# Patient Record
Sex: Female | Born: 1950
Health system: Southern US, Community
[De-identification: ages and names within clinical notes are randomized; demographics above are authoritative.]

## PROBLEM LIST (undated history)

## (undated) DIAGNOSIS — M549 Dorsalgia, unspecified: Secondary | ICD-10-CM

## (undated) DIAGNOSIS — R059 Cough, unspecified: Secondary | ICD-10-CM

## (undated) DIAGNOSIS — R05 Cough: Secondary | ICD-10-CM

## (undated) HISTORY — PX: SHOULDER ARTHROSCOPY W/ ROTATOR CUFF REPAIR: SHX2400

## (undated) HISTORY — DX: Cough, unspecified: R05.9

## (undated) HISTORY — PX: OTHER SURGICAL HISTORY: SHX169

## (undated) HISTORY — DX: Cough: R05

## (undated) HISTORY — PX: NECK SURGERY: SHX720

## (undated) HISTORY — PX: NASAL SINUS SURGERY: SHX719

## (undated) HISTORY — DX: Dorsalgia, unspecified: M54.9

---

## 1998-02-11 ENCOUNTER — Ambulatory Visit (HOSPITAL_COMMUNITY): Admission: RE | Admit: 1998-02-11 | Discharge: 1998-02-11 | Payer: Self-pay | Admitting: Neurosurgery

## 1999-04-19 ENCOUNTER — Emergency Department (HOSPITAL_COMMUNITY): Admission: EM | Admit: 1999-04-19 | Discharge: 1999-04-19 | Payer: Self-pay | Admitting: Emergency Medicine

## 1999-04-19 ENCOUNTER — Encounter: Payer: Self-pay | Admitting: Emergency Medicine

## 1999-11-26 ENCOUNTER — Ambulatory Visit (HOSPITAL_COMMUNITY): Admission: RE | Admit: 1999-11-26 | Discharge: 1999-11-26 | Payer: Self-pay | Admitting: Orthopedic Surgery

## 1999-11-26 ENCOUNTER — Encounter: Payer: Self-pay | Admitting: Orthopedic Surgery

## 1999-12-14 ENCOUNTER — Encounter: Payer: Self-pay | Admitting: Obstetrics and Gynecology

## 1999-12-14 ENCOUNTER — Encounter: Admission: RE | Admit: 1999-12-14 | Discharge: 1999-12-14 | Payer: Self-pay | Admitting: Obstetrics and Gynecology

## 2000-11-14 ENCOUNTER — Other Ambulatory Visit: Admission: RE | Admit: 2000-11-14 | Discharge: 2000-11-14 | Payer: Self-pay | Admitting: Obstetrics and Gynecology

## 2001-12-28 ENCOUNTER — Encounter: Admission: RE | Admit: 2001-12-28 | Discharge: 2001-12-28 | Payer: Self-pay | Admitting: Gynecology

## 2001-12-28 ENCOUNTER — Encounter: Payer: Self-pay | Admitting: Gynecology

## 2002-06-10 ENCOUNTER — Other Ambulatory Visit: Admission: RE | Admit: 2002-06-10 | Discharge: 2002-06-10 | Payer: Self-pay | Admitting: Obstetrics and Gynecology

## 2002-06-18 ENCOUNTER — Ambulatory Visit (HOSPITAL_COMMUNITY): Admission: RE | Admit: 2002-06-18 | Discharge: 2002-06-18 | Payer: Self-pay | Admitting: Obstetrics and Gynecology

## 2002-06-18 ENCOUNTER — Encounter: Payer: Self-pay | Admitting: Obstetrics and Gynecology

## 2003-01-03 ENCOUNTER — Encounter: Payer: Self-pay | Admitting: Obstetrics and Gynecology

## 2003-01-03 ENCOUNTER — Encounter: Admission: RE | Admit: 2003-01-03 | Discharge: 2003-01-03 | Payer: Self-pay | Admitting: Obstetrics and Gynecology

## 2004-06-22 ENCOUNTER — Other Ambulatory Visit: Admission: RE | Admit: 2004-06-22 | Discharge: 2004-06-22 | Payer: Self-pay | Admitting: Obstetrics and Gynecology

## 2005-06-02 ENCOUNTER — Emergency Department (HOSPITAL_COMMUNITY): Admission: EM | Admit: 2005-06-02 | Discharge: 2005-06-02 | Payer: Self-pay | Admitting: Emergency Medicine

## 2005-08-31 ENCOUNTER — Encounter: Payer: Self-pay | Admitting: Neurosurgery

## 2005-11-06 ENCOUNTER — Emergency Department (HOSPITAL_COMMUNITY): Admission: EM | Admit: 2005-11-06 | Discharge: 2005-11-07 | Payer: Self-pay | Admitting: Emergency Medicine

## 2006-03-15 ENCOUNTER — Ambulatory Visit: Payer: Self-pay | Admitting: Internal Medicine

## 2006-07-25 ENCOUNTER — Ambulatory Visit: Payer: Self-pay | Admitting: Internal Medicine

## 2006-10-09 ENCOUNTER — Encounter: Admission: RE | Admit: 2006-10-09 | Discharge: 2006-10-09 | Payer: Self-pay | Admitting: Obstetrics and Gynecology

## 2006-10-13 ENCOUNTER — Ambulatory Visit: Payer: Self-pay | Admitting: Internal Medicine

## 2007-11-07 ENCOUNTER — Ambulatory Visit: Payer: Self-pay | Admitting: Internal Medicine

## 2007-11-07 DIAGNOSIS — M79609 Pain in unspecified limb: Secondary | ICD-10-CM

## 2007-11-07 HISTORY — DX: Pain in unspecified limb: M79.609

## 2008-09-12 ENCOUNTER — Encounter: Admission: RE | Admit: 2008-09-12 | Discharge: 2008-09-12 | Payer: Self-pay | Admitting: Obstetrics and Gynecology

## 2008-10-29 ENCOUNTER — Telehealth (INDEPENDENT_AMBULATORY_CARE_PROVIDER_SITE_OTHER): Payer: Self-pay | Admitting: *Deleted

## 2008-10-30 ENCOUNTER — Ambulatory Visit: Payer: Self-pay | Admitting: Family Medicine

## 2009-02-18 ENCOUNTER — Ambulatory Visit: Payer: Self-pay | Admitting: Family Medicine

## 2009-02-18 DIAGNOSIS — M549 Dorsalgia, unspecified: Secondary | ICD-10-CM

## 2009-02-18 HISTORY — DX: Dorsalgia, unspecified: M54.9

## 2009-02-20 ENCOUNTER — Telehealth: Payer: Self-pay | Admitting: Internal Medicine

## 2009-02-21 ENCOUNTER — Emergency Department (HOSPITAL_COMMUNITY): Admission: EM | Admit: 2009-02-21 | Discharge: 2009-02-21 | Payer: Self-pay | Admitting: Family Medicine

## 2009-04-09 ENCOUNTER — Encounter: Payer: Self-pay | Admitting: Internal Medicine

## 2009-06-04 ENCOUNTER — Encounter: Payer: Self-pay | Admitting: Internal Medicine

## 2009-06-05 ENCOUNTER — Inpatient Hospital Stay (HOSPITAL_COMMUNITY): Admission: AD | Admit: 2009-06-05 | Discharge: 2009-06-09 | Payer: Self-pay | Admitting: Neurosurgery

## 2009-07-30 ENCOUNTER — Encounter: Payer: Self-pay | Admitting: Internal Medicine

## 2009-11-05 ENCOUNTER — Encounter: Payer: Self-pay | Admitting: Internal Medicine

## 2010-05-17 ENCOUNTER — Emergency Department (HOSPITAL_BASED_OUTPATIENT_CLINIC_OR_DEPARTMENT_OTHER)
Admission: EM | Admit: 2010-05-17 | Discharge: 2010-05-18 | Payer: Self-pay | Source: Home / Self Care | Admitting: Emergency Medicine

## 2010-05-19 LAB — BASIC METABOLIC PANEL
BUN: 17 mg/dL (ref 6–23)
CO2: 26 mEq/L (ref 19–32)
Calcium: 9.4 mg/dL (ref 8.4–10.5)
Chloride: 108 mEq/L (ref 96–112)
Creatinine, Ser: 0.8 mg/dL (ref 0.4–1.2)
GFR calc Af Amer: >60 mL/min (ref >60)
GFR calc non Af Amer: >60 mL/min (ref >60)
Glucose, Bld: 93 mg/dL (ref 70–99)
Potassium: 4 mEq/L (ref 3.5–5.1)
Sodium: 145 mEq/L (ref 135–145)

## 2010-05-19 LAB — D-DIMER, QUANTITATIVE: D-Dimer, Quant: 0.23 ug/mL-FEU (ref 0.00–0.48)

## 2010-06-01 NOTE — Letter (Signed)
Summary: [facility]  [facility]   Imported By: Maryln Gottron 05/19/2009 14:31:20  _____________________________________________________________________  External Attachment:    Type:   Image     Comment:   External Document

## 2010-06-01 NOTE — Letter (Signed)
Summary: [facility]  [facility]   Imported By: Maryln Gottron 06/18/2009 11:11:26  _____________________________________________________________________  External Attachment:    Type:   Image     Comment:   External Document

## 2010-06-01 NOTE — Letter (Signed)
Summary: [facility]-page 2&3  [facility]-page 2&3   Imported By: Maryln Gottron 06/04/2009 10:21:02  _____________________________________________________________________  External Attachment:    Type:   Image     Comment:   External Document

## 2010-06-01 NOTE — Letter (Signed)
Summary: [facility]  [facility]   Imported By: Maryln Gottron 12/03/2009 10:51:50  _____________________________________________________________________  External Attachment:    Type:   Image     Comment:   External Document

## 2010-06-01 NOTE — Letter (Signed)
Summary: [facility]  [facility]   Imported By: Maryln Gottron 08/19/2009 14:06:29  _____________________________________________________________________  External Attachment:    Type:   Image     Comment:   External Document

## 2010-07-12 ENCOUNTER — Encounter: Payer: Self-pay | Admitting: Family Medicine

## 2010-07-12 ENCOUNTER — Ambulatory Visit (INDEPENDENT_AMBULATORY_CARE_PROVIDER_SITE_OTHER): Payer: BC Managed Care – PPO | Admitting: Family Medicine

## 2010-07-12 DIAGNOSIS — J309 Allergic rhinitis, unspecified: Secondary | ICD-10-CM

## 2010-07-12 MED ORDER — AZELASTINE HCL 0.1 % NA SOLN: 1.0000 | Freq: Two times a day (BID) | NASAL | Status: DC

## 2010-07-12 NOTE — Progress Notes (Signed)
  Subjective:    Patient ID: Kathryn Phillips, female    DOB: [birth year], 60 y.o.   MRN: [id number]  HPI  patient seen with a two-week history of symptoms including watery eyes and clear nasal mucus and nasal congestion. Took Alka-Seltzer plus without much improvement.   She has increased cough which is mostly nonproductive. Also took  Theraflu without improvement.    Has history of seasonal allergies in the past. Denies any fever, chills, or purulent discharge. Nonsmoker.   Review of Systems  Constitutional: Negative for fever and chills.  HENT: Positive for congestion, rhinorrhea, sneezing and postnasal drip. Negative for sore throat.   Respiratory: Positive for cough. Negative for shortness of breath and wheezing.   Cardiovascular: Negative for chest pain.  Skin: Negative for rash.  Hematological: Negative for adenopathy.       Objective:   Physical Exam  patient alert and in no distress. Afebrile. Oropharynx is moist and clear Nasal exam reveals clear artery Eardrums no acute change Neck supple no adenopathy Chest clear to auscultation Heart regular rhythm and rate       Assessment & Plan:   seasonal allergic rhinitis. Prescription for Astelin nasal spray 1-2 sprays per nostril twice daily. Try over-the-counter Zyrtec or Allegra

## 2010-07-12 NOTE — Patient Instructions (Signed)
Take over the counter Allegra or Zyrtec. Follow up immediately if you develop any fever or other problems.

## 2010-07-19 ENCOUNTER — Encounter: Payer: Self-pay | Admitting: Family Medicine

## 2010-07-19 ENCOUNTER — Ambulatory Visit (INDEPENDENT_AMBULATORY_CARE_PROVIDER_SITE_OTHER): Payer: BC Managed Care – PPO | Admitting: Family Medicine

## 2010-07-19 DIAGNOSIS — J309 Allergic rhinitis, unspecified: Secondary | ICD-10-CM

## 2010-07-19 HISTORY — DX: Allergic rhinitis, unspecified: J30.9

## 2010-07-19 MED ORDER — PREDNISONE 10 MG PO TABS: ORAL_TABLET | ORAL | Status: DC

## 2010-07-19 MED ORDER — CHLORPHENIRAMINE-HYDROCODONE 8-10 MG/5ML PO LQCR: 5.0000 mL | Freq: Two times a day (BID) | ORAL | Status: AC | PRN

## 2010-07-19 NOTE — Progress Notes (Signed)
  Subjective:    Patient ID: Kathryn Phillips, female    DOB: 1950/10/10, 60 y.o.   MRN: [id number]  HPI  patient seen in followup from last visit. Refer to prior note. Using Astelin nasal without much improvement.  Visiting [place] part of the state of the weekend where pollen heavy and that seemed to worsen her symptoms. Clear nasal mucus. Postnasal drip with nighttime cough preventing sleep. Only 3 hours sleep per night. No fever. No purulent nasal discharge. Using Astelin nasal one spray per nostril twice daily. Has used Nasonex in the past.   Review of Systems  HENT: Positive for congestion, rhinorrhea, sneezing, postnasal drip and sinus pressure. Negative for hearing loss, ear pain, nosebleeds, sore throat, neck stiffness and voice change.   Respiratory: Positive for cough. Negative for shortness of breath and wheezing.        Objective:   Physical Exam  patient alert and in no distress.  Nasal exam reveals swollen mucosa with clear mucus. Sinuses nontender Ear drums normal OP clear Neck supple no adenopathy Chest clear to auscultation Heart regular rhythm and rate       Assessment & Plan:   allergic rhinitis. Increase Astelin 2 sprays per nostril twice daily. Tussionex for nighttime cough 1/2-1 teaspoon. Consider brief prednisone taper if symptoms not improving next couple days

## 2010-07-19 NOTE — Patient Instructions (Signed)
May increase astelin to 2 sprays per nostril twice daily.

## 2010-07-22 LAB — CBC
HCT: 37.6 % (ref 36.0–46.0)
Hemoglobin: 12.7 g/dL (ref 12.0–15.0)
MCHC: 33.8 g/dL (ref 30.0–36.0)
MCV: 88.7 fL (ref 78.0–100.0)
Platelets: 213 10*3/uL (ref 150–400)
RBC: 4.24 MIL/uL (ref 3.87–5.11)
RDW: 13.4 % (ref 11.5–15.5)
WBC: 5.1 10*3/uL (ref 4.0–10.5)

## 2010-07-27 ENCOUNTER — Telehealth: Payer: Self-pay | Admitting: *Deleted

## 2010-07-27 NOTE — Telephone Encounter (Signed)
Faxed refill request to fill Hydrocodone-chlorpheniram susp, 60ml, one tsp every 12 hours for cough

## 2010-07-28 ENCOUNTER — Telehealth: Payer: Self-pay | Admitting: *Deleted

## 2010-07-28 NOTE — Telephone Encounter (Signed)
Faxed request to fill Hydrocodone Chloroheniram susp cough syrup, 60 ml [facility] [place]

## 2010-07-29 ENCOUNTER — Emergency Department (HOSPITAL_BASED_OUTPATIENT_CLINIC_OR_DEPARTMENT_OTHER)
Admission: EM | Admit: 2010-07-29 | Discharge: 2010-07-29 | Disposition: A | Discharge status: Home / Self Care | Payer: Federal, State, Local not specified - PPO | Attending: Emergency Medicine | Admitting: Emergency Medicine

## 2010-07-29 ENCOUNTER — Emergency Department (INDEPENDENT_AMBULATORY_CARE_PROVIDER_SITE_OTHER): Payer: Federal, State, Local not specified - PPO

## 2010-07-29 DIAGNOSIS — R05 Cough: Secondary | ICD-10-CM

## 2010-07-29 DIAGNOSIS — R0789 Other chest pain: Secondary | ICD-10-CM | POA: Insufficient documentation

## 2010-07-29 DIAGNOSIS — R059 Cough, unspecified: Secondary | ICD-10-CM

## 2010-07-29 DIAGNOSIS — R071 Chest pain on breathing: Secondary | ICD-10-CM | POA: Insufficient documentation

## 2010-07-29 DIAGNOSIS — R079 Chest pain, unspecified: Secondary | ICD-10-CM

## 2010-08-17 NOTE — Telephone Encounter (Signed)
Rx called in, pt aware 

## 2011-07-05 ENCOUNTER — Encounter: Payer: Self-pay | Admitting: Obstetrics and Gynecology

## 2011-08-17 ENCOUNTER — Emergency Department (INDEPENDENT_AMBULATORY_CARE_PROVIDER_SITE_OTHER): Payer: Federal, State, Local not specified - PPO

## 2011-08-17 ENCOUNTER — Emergency Department (HOSPITAL_BASED_OUTPATIENT_CLINIC_OR_DEPARTMENT_OTHER)
Admission: EM | Admit: 2011-08-17 | Discharge: 2011-08-18 | Disposition: A | Discharge status: Home / Self Care | Payer: Federal, State, Local not specified - PPO | Attending: Emergency Medicine | Admitting: Emergency Medicine

## 2011-08-17 ENCOUNTER — Encounter (HOSPITAL_BASED_OUTPATIENT_CLINIC_OR_DEPARTMENT_OTHER): Payer: Self-pay | Admitting: Emergency Medicine

## 2011-08-17 DIAGNOSIS — J3489 Other specified disorders of nose and nasal sinuses: Secondary | ICD-10-CM | POA: Insufficient documentation

## 2011-08-17 DIAGNOSIS — J40 Bronchitis, not specified as acute or chronic: Secondary | ICD-10-CM

## 2011-08-17 DIAGNOSIS — R059 Cough, unspecified: Secondary | ICD-10-CM

## 2011-08-17 DIAGNOSIS — R05 Cough: Secondary | ICD-10-CM

## 2011-08-17 DIAGNOSIS — R062 Wheezing: Secondary | ICD-10-CM | POA: Insufficient documentation

## 2011-08-17 DIAGNOSIS — R0982 Postnasal drip: Secondary | ICD-10-CM | POA: Insufficient documentation

## 2011-08-17 MED ORDER — PREDNISONE 50 MG PO TABS
60.0000 mg | ORAL_TABLET | Freq: Once | ORAL | Status: AC
  Administered 2011-08-17: 60 mg via ORAL
  Filled 2011-08-17: qty 1

## 2011-08-17 MED ORDER — ALBUTEROL SULFATE (5 MG/ML) 0.5% IN NEBU
5.0000 mg | INHALATION_SOLUTION | Freq: Once | RESPIRATORY_TRACT | Status: AC
  Administered 2011-08-17: 5 mg via RESPIRATORY_TRACT
  Filled 2011-08-17 (×2): qty 0.5

## 2011-08-17 MED ORDER — IPRATROPIUM BROMIDE 0.02 % IN SOLN
0.5000 mg | Freq: Once | RESPIRATORY_TRACT | Status: AC
  Administered 2011-08-17: 0.5 mg via RESPIRATORY_TRACT

## 2011-08-17 MED ORDER — ALBUTEROL SULFATE (5 MG/ML) 0.5% IN NEBU
INHALATION_SOLUTION | RESPIRATORY_TRACT | Status: AC
  Administered 2011-08-17: 5 mg via RESPIRATORY_TRACT
  Filled 2011-08-17: qty 1

## 2011-08-17 MED ORDER — IPRATROPIUM BROMIDE 0.02 % IN SOLN
RESPIRATORY_TRACT | Status: AC
  Administered 2011-08-17: 0.5 mg via RESPIRATORY_TRACT
  Filled 2011-08-17: qty 2.5

## 2011-08-17 MED ORDER — ALBUTEROL SULFATE (5 MG/ML) 0.5% IN NEBU
5.0000 mg | INHALATION_SOLUTION | Freq: Once | RESPIRATORY_TRACT | Status: AC
  Administered 2011-08-17: 5 mg via RESPIRATORY_TRACT

## 2011-08-17 NOTE — ED Notes (Signed)
Pt reports sinus drainage with cough since her sinus surgery 6months ago. Pt has f/u with her surgeon twice before today, and has another appt on May 2 for same complaints. State she has continuous sinus drainage that worsens at night. The coughing has worsened "to the point that i cant even sleep at night now". Pt has been taking a nasal antibiotic "for weeks now".

## 2011-08-17 NOTE — ED Notes (Signed)
Pt c/o chest congestion and productive cough. Pt also has left rib pain.

## 2011-08-18 MED ORDER — DOXYCYCLINE HYCLATE 100 MG PO CAPS: 100.0000 mg | ORAL_CAPSULE | Freq: Two times a day (BID) | ORAL | Status: AC

## 2011-08-18 MED ORDER — IPRATROPIUM-ALBUTEROL 18-103 MCG/ACT IN AERO: 2.0000 | INHALATION_SPRAY | Freq: Four times a day (QID) | RESPIRATORY_TRACT | Status: DC | PRN

## 2011-08-18 MED ORDER — PREDNISONE 50 MG PO TABS: 50.0000 mg | ORAL_TABLET | Freq: Every day | ORAL | Status: DC

## 2011-08-18 MED ORDER — IBUPROFEN 800 MG PO TABS
800.0000 mg | ORAL_TABLET | Freq: Once | ORAL | Status: AC
  Administered 2011-08-18: 800 mg via ORAL
  Filled 2011-08-18: qty 1

## 2011-08-18 NOTE — ED Provider Notes (Signed)
History     CSN: [id number]  Arrival date & time 08/17/11  2102   First MD Initiated Contact with Patient 08/17/11 2313      Chief Complaint  Patient presents with  . Cough    (Consider location/radiation/quality/duration/timing/severity/associated sxs/prior treatment) Patient is a 61 y.o. female presenting with cough. The history is provided by the patient. No language interpreter was used.  Cough This is a recurrent problem. The current episode started more than 1 week ago. The problem occurs hourly. The problem has not changed since onset.Cough characteristics: clear sputum. There has been no fever. Associated symptoms include rhinorrhea and wheezing. Pertinent negatives include no chest pain, no chills, no sweats, no weight loss, no ear congestion, no ear pain, no headaches, no sore throat, no myalgias, no shortness of breath and no eye redness. She has tried decongestants for the symptoms. The treatment provided no relief. She is a smoker. Her past medical history does not include pneumonia.    Past Medical History  Diagnosis Date  . BACK PAIN, UPPER 02/18/2009    Past Surgical History  Procedure Date  . Cesarean section   . Myonectomy   . Shoulder arthroscopy w/ rotator cuff repair     No family history on file.  History  Substance Use Topics  . Smoking status: Passive Smoker  . Smokeless tobacco: Not on file  . Alcohol Use: No    OB History    Grav Para Term Preterm Abortions TAB SAB Ect Mult Living                  Review of Systems  Constitutional: Negative for chills and weight loss.  HENT: Positive for rhinorrhea and postnasal drip. Negative for ear pain and sore throat.   Eyes: Negative for redness.  Respiratory: Positive for cough and wheezing. Negative for shortness of breath.   Cardiovascular: Negative for chest pain, palpitations and leg swelling.  Gastrointestinal: Negative.   Genitourinary: Negative.   Musculoskeletal: Negative.  Negative for  myalgias.  Skin: Negative.   Neurological: Negative for headaches.  Hematological: Negative.   Psychiatric/Behavioral: Negative.     Allergies  Review of patient's allergies indicates no known allergies.  Home Medications   Current Outpatient Rx  Name Route Sig Dispense Refill  . ACETAMINOPHEN 100 MG/ML PO SOLN Oral Take 10 mg/kg by mouth every 4 (four) hours as needed. Patient used this medication for pain.    . IBUPROFEN 200 MG PO TABS Oral Take 400 mg by mouth every 6 (six) hours as needed. Patient used this medication for a headache.    Arloa Koh D PO Oral Take 10 mLs by mouth daily as needed. Patient used this medication for congestion.      BP 112/69  Pulse 111  Temp(Src) 98.5 F (36.9 C) (Oral)  Resp 18  SpO2 99%  Physical Exam  Constitutional: She is oriented to person, place, and time. She appears well-developed and well-nourished. No distress.  HENT:  Head: Normocephalic and atraumatic.  Mouth/Throat: Oropharynx is clear and moist.  Eyes: Conjunctivae are normal. Pupils are equal, round, and reactive to light.  Neck: Normal range of motion. Neck supple.  Cardiovascular: Normal rate and regular rhythm.   Pulmonary/Chest: She has wheezes.  Abdominal: Soft. Bowel sounds are normal. There is no tenderness. There is no rebound and no guarding.  Musculoskeletal: Normal range of motion. She exhibits no edema and no tenderness.  Lymphadenopathy:    She has no cervical adenopathy.  Neurological: She  is alert and oriented to person, place, and time.  Skin: Skin is warm and dry. She is not diaphoretic.  Psychiatric: She has a normal mood and affect.    ED Course  Procedures (including critical care time)  Labs Reviewed - No data to display Dg Chest 2 View  08/17/2011  *RADIOLOGY REPORT*  Clinical Data: Cough for 1 month.  CHEST - 2 VIEW  Comparison: Chest radiograph performed 07/29/2010  Findings: The lungs are well-aerated.  There is no evidence of focal  opacification, pleural effusion or pneumothorax.  Slightly increased haziness at the left lung base is thought to reflect overlying structures.  The heart is normal in size; the mediastinal contour is within normal limits.  No acute osseous abnormalities are seen.  Cervical spinal fusion hardware is partially imaged.  IMPRESSION: No acute cardiopulmonary process seen.  Original Report Authenticated By: Tonia Ghent, M.D.     No diagnosis found.    MDM  Follow up with Dr. Carolyne Fiscal and Dr. Cato Mulligan.  Post nasal drip playing a role.  Also question allergies.  Will need close follow up and question need for suppressive medication as is a recurrent problem.  Return for chest pain, shortness of breath fevers, or any concerns.  Follow up this week.  Patient and husband verbalize understanding and agree to follow up       Keller Bounds K Brittney Caraway-Rasch, MD 08/18/11 1610

## 2011-08-18 NOTE — ED Notes (Signed)
Pt states that she feels a lot better after the two HH[facility] tx.

## 2011-08-18 NOTE — Discharge Instructions (Signed)
Bronchitis Bronchitis is a problem of the air tubes leading to your lungs. This problem makes it hard for air to get in and out of the lungs. You may cough a lot because your air tubes are narrow. Going without care can cause lasting (chronic) bronchitis. HOME CARE   Drink enough fluids to keep your pee (urine) clear or pale yellow.   Use a cool mist humidifier.   Quit smoking if you smoke. If you keep smoking, the bronchitis might not get better.   Only take medicine as told by your doctor.  GET HELP RIGHT AWAY IF:   Coughing keeps you awake.   You start to wheeze.   You become more sick or weak.   You have a hard time breathing or get short of breath.   You cough up blood.   Coughing lasts more than 2 weeks.   You have a fever.   Your baby is older than 3 months with a rectal temperature of 102 F (38.9 C) or higher.   Your baby is 3 months old or younger with a rectal temperature of 100.4 F (38 C) or higher.  MAKE SURE YOU:  Understand these instructions.   Will watch your condition.   Will get help right away if you are not doing well or get worse.  Document Released: 10/05/2007 Document Revised: 04/07/2011 Document Reviewed: 03/20/2009 Exit[employer] Patient Information 2012 [employer]. 

## 2011-08-22 ENCOUNTER — Ambulatory Visit (INDEPENDENT_AMBULATORY_CARE_PROVIDER_SITE_OTHER): Payer: Federal, State, Local not specified - PPO | Admitting: Family

## 2011-08-22 ENCOUNTER — Encounter: Payer: Self-pay | Admitting: Family

## 2011-08-22 VITALS — BP 140/82 | Temp 98.6°F | Wt 191.0 lb

## 2011-08-22 DIAGNOSIS — R05 Cough: Secondary | ICD-10-CM

## 2011-08-22 DIAGNOSIS — J40 Bronchitis, not specified as acute or chronic: Secondary | ICD-10-CM

## 2011-08-22 MED ORDER — HYDROCOD POLST-CHLORPHEN POLST 10-8 MG/5ML PO LQCR: 5.0000 mL | Freq: Two times a day (BID) | ORAL | Status: DC | PRN

## 2011-08-22 NOTE — Progress Notes (Signed)
Subjective:    Patient ID: Kathryn Phillips, female    DOB: Oct 02, 1950, 61 y.o.   MRN: [id number]  HPI 61 year old [place] American female, patient of Dr. Dorris Singh is in today as an emergency department followup. She was diagnosed with bronchitis at the emergency department 4 days ago. It was prescribed prednisone, albuterol, and an antibiotic. Today, she is much better. However, she continues to have a cough that keeps her awake at night. Denies any lightheadedness, dizziness, chest pain, palpitations, shortness of breath or edema.   Review of Systems  Constitutional: Negative.   HENT: Positive for congestion and postnasal drip. Negative for sore throat, sinus pressure and ear discharge.   Eyes: Negative.   Respiratory: Positive for cough and wheezing. Negative for shortness of breath.   Skin: Negative.   Hematological: Negative.   Psychiatric/Behavioral: Negative.    Past Medical History  Diagnosis Date  . BACK PAIN, UPPER 02/18/2009    History   Social History  . Marital Status: Married    Spouse Name: N/A    Number of Children: N/A  . Years of Education: N/A   Occupational History  . Not on file.   Social History Main Topics  . Smoking status: Passive Smoker  . Smokeless tobacco: Not on file  . Alcohol Use: No  . Drug Use: No  . Sexually Active: Not on file   Other Topics Concern  . Not on file   Social History Narrative  . No narrative on file    Past Surgical History  Procedure Date  . Cesarean section   . Myonectomy   . Shoulder arthroscopy w/ rotator cuff repair     No family history on file.  No Known Allergies  Current Outpatient Prescriptions on File Prior to Visit  Medication Sig Dispense Refill  . albuterol-ipratropium (COMBIVENT) 18-103 MCG/ACT inhaler Inhale 2 puffs into the lungs every 6 (six) hours as needed for wheezing.  1 Inhaler  0  . doxycycline (VIBRAMYCIN) 100 MG capsule Take 1 capsule (100 mg total) by mouth 2 (two) times daily. One po  bid x 7 days  14 capsule  0  . ibuprofen (ADVIL,MOTRIN) 200 MG tablet Take 400 mg by mouth every 6 (six) hours as needed. Patient used this medication for a headache.      . predniSONE (DELTASONE) 50 MG tablet Take 1 tablet (50 mg total) by mouth daily.  5 tablet  0  . acetaminophen (TYLENOL) 100 MG/ML solution Take 10 mg/kg by mouth every 4 (four) hours as needed. Patient used this medication for pain.      . Pseudoephedrine-Guaifenesin (MUCINEX D PO) Take 10 mLs by mouth daily as needed. Patient used this medication for congestion.        BP 140/82  Temp(Src) 98.6 F (37 C) (Oral)  Wt 191 lb (86.637 kg)chart    Objective:   Physical Exam  Constitutional: She is oriented to person, place, and time. She appears well-developed and well-nourished.  HENT:  Head: Normocephalic and atraumatic.  Right Ear: External ear normal.  Left Ear: External ear normal.  Neck: Normal range of motion. Neck supple.  Cardiovascular: Normal rate, regular rhythm and normal heart sounds.   Pulmonary/Chest: Effort normal and breath sounds normal.  Neurological: She is alert and oriented to person, place, and time.  Skin: Skin is warm and dry.  Psychiatric: She has a normal mood and affect.          Assessment & Plan:  Assessment: Acute bronchitis,  cough  Plan: Tussionex 1 teaspoon twice a day when necessary. Continue current medications. Rest. Drink plenty of fluids. Patient call the office if symptoms worsen or persist, recheck as scheduled and when necessary.

## 2011-08-22 NOTE — Patient Instructions (Signed)

## 2011-09-12 ENCOUNTER — Telehealth: Payer: Self-pay | Admitting: Internal Medicine

## 2011-09-12 DIAGNOSIS — J40 Bronchitis, not specified as acute or chronic: Secondary | ICD-10-CM

## 2011-09-12 NOTE — Telephone Encounter (Signed)
Ok per Dr Swords referral order placed 

## 2011-09-12 NOTE — Telephone Encounter (Signed)
Pt was just in recently for in rd follow up on bronchitis. Pt is still having issues with her breathing and would like to be referred to [facility]. Please contact pt

## 2011-09-15 ENCOUNTER — Encounter: Payer: Self-pay | Admitting: Internal Medicine

## 2011-09-15 ENCOUNTER — Ambulatory Visit (INDEPENDENT_AMBULATORY_CARE_PROVIDER_SITE_OTHER): Payer: Federal, State, Local not specified - PPO | Admitting: Internal Medicine

## 2011-09-15 VITALS — BP 118/82 | HR 94 | Temp 97.4°F | Ht 65.0 in | Wt 191.4 lb

## 2011-09-15 DIAGNOSIS — R05 Cough: Secondary | ICD-10-CM

## 2011-09-15 MED ORDER — PANTOPRAZOLE SODIUM 40 MG PO TBEC: 40.0000 mg | DELAYED_RELEASE_TABLET | Freq: Every day | ORAL | Status: DC

## 2011-09-15 MED ORDER — FLUTICASONE PROPIONATE 50 MCG/ACT NA SUSP: 2.0000 | Freq: Every day | NASAL | Status: DC

## 2011-09-15 NOTE — Patient Instructions (Signed)
Cough is from sinus drainage, acid reflux, possible asthma,  All of this is working together to cause cyclical cough/LPR cough  #Sinus drainage  - start/continue netti pot daily (nurse will show you picture) -  start nasal steroid generic fluticasone inhaler 2 squirts each nostril daily as advised (nurse will do script)  #Possible Acid Reflux - start protonix 40mg  daily on empty stomach (nurse will do script),   - If protonix expensive,  take otc zegerid 20mg   1 capsule daily on empty stomach    - take diet sheet from [place] - avoid colas, spices, cheeses, spirits, red meats, beer, chocolates, fried foods etc.,   - sleep with head end of bed elevated  - eat small frequent meals  - do not go to bed for 3 hours after last meal  #Possible Asthma  - do methacholine challenge test asap next week (you cannot be pregnant or have heart disease for this test)  -once test done, call us for result review  #Cyclical cough  - please choose 2-3 days and observe complete voice rest - no talking or whispering  - at all times there  there is urge to cough, drink water or swallow or sip on throat lozenge  #Followup - I will see you in 4 weeks.  - cough score at followup - any problems call or come sooner

## 2011-09-15 NOTE — Progress Notes (Signed)
Subjective:    Patient ID: Kathryn Phillips, female    DOB: 06-Mar-1951, 61 y.o.   MRN: [id number]  HPI  IOV 09/15/2011  61 year old female. Body mass index is 31.85 kg/(m^2).  reports that she has never smoked. She does not have any smokeless tobacco history on file. Judie Petit, MD . ENT is Dr Madelynn Done at [facility]. She works in Chief Financial Officer in Clinical biochemist at [employer]  Referred for chronic cough. Has has 2 year history of sinus problems (polyps, blockage).  Reports chronic cough since Nov 2012. She felt this was due to chronic sinus drainage and had sinus surgery in Nov 2012 but this did not help cough. Says many different types of anti-tussives not helped. Cough worse at night and she needs to sit up to get relief. Describes episodes of cough and each cough episode can last hours. Course complicated by 1 ER visit that resulted in nebulizer Rx at [facility] and dc ? On combivent. Also, took 6 days of prednisone that helped somewhat.  Cough is associated with white thick mucus. Course is stable and unchanged. Cough worst at night, laughing, talking a lot. Cough improved by drinking hot tea or something warm to drink or staying quiet.  She denies GERD. She denies cola intake but drinks 1 coffee daily. Only occassional cheese. Denies wine intake. Denies ACE inhibitor intake.   RSI cough score 35 of total 45 and indicates severe and LPR cough   Dr Gretta Cool Reflux Symptom Index (> 13-15 suggestive of LPR cough) 0 -> 5  =  none ->severe problem  Hoarseness of problem with voice 2  Clearing  Of Throat 5  Excess throat mucus or feeling of post nasal drip 5  Difficulty swallowing food, liquid or tablets 3  Cough after eating or lying down 5  Breathing difficulties or choking episodes 4  Troublesome or annoying cough 5  Sensation of something sticking in throat or lump in throat 4  Heartburn, chest pain, indigestion, or stomach acid coming up 2  TOTAL 35     CXR 08/17/2011 - clear  (personally reviewed image and agree with assessment)   Past Medical History  Diagnosis Date  . BACK PAIN, UPPER 02/18/2009  . Cough      Family History  Problem Relation Age of Onset  . Diabetes Father      History   Social History  . Marital Status: Married    Spouse Name: N/A    Number of Children: N/A  . Years of Education: N/A   Occupational History  . Not on file.   Social History Main Topics  . Smoking status: Never Smoker   . Smokeless tobacco: Not on file  . Alcohol Use: No  . Drug Use: No  . Sexually Active: Not on file   Other Topics Concern  . Not on file   Social History Narrative  . No narrative on file     Allergies  Allergen Reactions  . Hydrocodone Nausea And Vomiting     Outpatient Prescriptions Prior to Visit  Medication Sig Dispense Refill  . acetaminophen (TYLENOL) 100 MG/ML solution Take 10 mg/kg by mouth every 4 (four) hours as needed. Patient used this medication for pain.      Marland Kitchen albuterol-ipratropium (COMBIVENT) 18-103 MCG/ACT inhaler Inhale 2 puffs into the lungs every 6 (six) hours as needed for wheezing.  1 Inhaler  0  . ibuprofen (ADVIL,MOTRIN) 200 MG tablet Take 400 mg by mouth  every 6 (six) hours as needed. Patient used this medication for a headache.      . chlorpheniramine-HYDROcodone (TUSSIONEX PENNKINETIC ER) 10-8 MG/5ML LQCR Take 5 mLs by mouth every 12 (twelve) hours as needed.  140 mL  0  . predniSONE (DELTASONE) 50 MG tablet Take 1 tablet (50 mg total) by mouth daily.  5 tablet  0  . Pseudoephedrine-Guaifenesin (MUCINEX D PO) Take 10 mLs by mouth daily as needed. Patient used this medication for congestion.            Review of Systems  Constitutional: Negative for fever and unexpected weight change.  HENT: Positive for congestion, postnasal drip and sinus pressure. Negative for ear pain, nosebleeds, sore throat, rhinorrhea, sneezing, trouble swallowing and dental problem.   Eyes: Negative for redness and itching.    Respiratory: Positive for cough, chest tightness, shortness of breath and wheezing.   Cardiovascular: Negative for palpitations and leg swelling.  Gastrointestinal: Negative for nausea and vomiting.  Genitourinary: Negative for dysuria.  Musculoskeletal: Negative for joint swelling.  Skin: Negative for rash.  Neurological: Negative for headaches.  Hematological: Does not bruise/bleed easily.  Psychiatric/Behavioral: Negative for dysphoric mood. The patient is not nervous/anxious.        Objective:   Physical Exam  Vitals reviewed. Constitutional: She is oriented to person, place, and time. She appears well-developed and well-nourished. No distress.  HENT:  Head: Normocephalic and atraumatic.  Right Ear: External ear normal.  Left Ear: External ear normal.  Mouth/Throat: Oropharynx is clear and moist. No oropharyngeal exudate.  Eyes: Conjunctivae and EOM are normal. Pupils are equal, round, and reactive to light. Right eye exhibits no discharge. Left eye exhibits no discharge. No scleral icterus.  Neck: Normal range of motion. Neck supple. No JVD present. No tracheal deviation present. No thyromegaly present.       Post nasal drip - mild +  Cardiovascular: Normal rate, regular rhythm, normal heart sounds and intact distal pulses.  Exam reveals no gallop and no friction rub.   No murmur heard. Pulmonary/Chest: Effort normal and breath sounds normal. No respiratory distress. She has no wheezes. She has no rales. She exhibits no tenderness.       Forced expiratory wheeze  Abdominal: Soft. Bowel sounds are normal. She exhibits no distension and no mass. There is no tenderness. There is no rebound and no guarding.  Musculoskeletal: Normal range of motion. She exhibits no edema and no tenderness.  Lymphadenopathy:    She has no cervical adenopathy.  Neurological: She is alert and oriented to person, place, and time. She has normal reflexes. No cranial nerve deficit. She exhibits normal  muscle tone. Coordination normal.  Skin: Skin is warm and dry. No rash noted. She is not diaphoretic. No erythema. No pallor.  Psychiatric: She has a normal mood and affect. Her behavior is normal. Judgment and thought content normal.          Assessment & Plan:

## 2011-09-18 DIAGNOSIS — R053 Chronic cough: Secondary | ICD-10-CM | POA: Insufficient documentation

## 2011-09-18 DIAGNOSIS — R05 Cough: Secondary | ICD-10-CM | POA: Insufficient documentation

## 2011-09-18 NOTE — Assessment & Plan Note (Signed)
Cough is from sinus drainage, acid reflux, possible asthma,  All of this is working together to cause cyclical cough/LPR cough  #Sinus drainage  - start/continue netti pot daily (nurse will show you picture) -  start nasal steroid generic fluticasone inhaler 2 squirts each nostril daily as advised (nurse will do script)  #Possible Acid Reflux - start protonix 40mg daily on empty stomach (nurse will do script),   - If protonix expensive,  take otc zegerid 20mg  1 capsule daily on empty stomach    - take diet sheet from us - avoid colas, spices, cheeses, spirits, red meats, beer, chocolates, fried foods etc.,   - sleep with head end of bed elevated  - eat small frequent meals  - do not go to bed for 3 hours after last meal  #Possible Asthma  - do methacholine challenge test asap next week (you cannot be pregnant or have heart disease for this test)  -once test done, call us for result review  #Cyclical cough  - please choose 2-3 days and observe complete voice rest - no talking or whispering  - at all times there  there is urge to cough, drink water or swallow or sip on throat lozenge  #Followup - I will see you in 4 weeks.  - cough score at followup - any problems call or come sooner  

## 2011-09-22 ENCOUNTER — Ambulatory Visit (HOSPITAL_COMMUNITY)
Admission: RE | Admit: 2011-09-22 | Discharge: 2011-09-22 | Disposition: A | Discharge status: Home / Self Care | Payer: Federal, State, Local not specified - PPO | Source: Ambulatory Visit | Attending: Internal Medicine | Admitting: Internal Medicine

## 2011-09-22 DIAGNOSIS — R05 Cough: Secondary | ICD-10-CM | POA: Insufficient documentation

## 2011-09-22 DIAGNOSIS — R059 Cough, unspecified: Secondary | ICD-10-CM | POA: Insufficient documentation

## 2011-09-22 LAB — PULMONARY FUNCTION TEST

## 2011-09-22 MED ORDER — ALBUTEROL SULFATE (5 MG/ML) 0.5% IN NEBU
2.5000 mg | INHALATION_SOLUTION | Freq: Once | RESPIRATORY_TRACT | Status: AC
  Administered 2011-09-22: 2.5 mg via RESPIRATORY_TRACT

## 2011-09-22 MED ORDER — METHACHOLINE 1 MG/ML NEB SOLN
2.0000 mL | Freq: Once | RESPIRATORY_TRACT | Status: AC
  Administered 2011-09-22: 2 mg via RESPIRATORY_TRACT

## 2011-09-22 MED ORDER — METHACHOLINE 4 MG/ML NEB SOLN
2.0000 mL | Freq: Once | RESPIRATORY_TRACT | Status: AC
  Administered 2011-09-22: 8 mg via RESPIRATORY_TRACT

## 2011-09-22 MED ORDER — METHACHOLINE 0.25 MG/ML NEB SOLN
2.0000 mL | Freq: Once | RESPIRATORY_TRACT | Status: AC
  Administered 2011-09-22: 0.5 mg via RESPIRATORY_TRACT

## 2011-09-22 MED ORDER — METHACHOLINE 0.0625 MG/ML NEB SOLN
2.0000 mL | Freq: Once | RESPIRATORY_TRACT | Status: AC
  Administered 2011-09-22: 0.125 mg via RESPIRATORY_TRACT

## 2011-09-22 MED ORDER — METHACHOLINE 16 MG/ML NEB SOLN
2.0000 mL | Freq: Once | RESPIRATORY_TRACT | Status: AC
  Administered 2011-09-22: 32 mg via RESPIRATORY_TRACT

## 2011-09-22 MED ORDER — SODIUM CHLORIDE 0.9 % IN NEBU
3.0000 mL | INHALATION_SOLUTION | Freq: Once | RESPIRATORY_TRACT | Status: AC
  Administered 2011-09-22: 3 mL via RESPIRATORY_TRACT

## 2011-09-27 ENCOUNTER — Telehealth: Payer: Self-pay | Admitting: Internal Medicine

## 2011-09-27 NOTE — Telephone Encounter (Signed)
Methacholine challenge 09/22/11 is inderminat. Please tell her I will see her at fu 4 weeks from mid may 2013 office visit and then decide on further course  For my perusal  - PC20 is 16mg  with 28% drop in fev1

## 2011-09-28 NOTE — Telephone Encounter (Signed)
ATC home number NA, no voicemail. ATC cell number NA, no voicemail. WCB.Carron Curie, CMA

## 2011-10-05 NOTE — Telephone Encounter (Signed)
ATC pt again and home number is disconnected and cell number will not accept incoming calls.  I was able to leave a message on work number. Carron Curie, CMA

## 2011-10-07 NOTE — Telephone Encounter (Signed)
Pt scheduled on 10-26-11 at 9:45am.Josephanthony Tindel Bono, CMA

## 2011-10-13 ENCOUNTER — Ambulatory Visit: Payer: Federal, State, Local not specified - PPO | Admitting: Internal Medicine

## 2011-10-26 ENCOUNTER — Ambulatory Visit (INDEPENDENT_AMBULATORY_CARE_PROVIDER_SITE_OTHER): Payer: Federal, State, Local not specified - PPO | Admitting: Internal Medicine

## 2011-10-26 ENCOUNTER — Encounter: Payer: Self-pay | Admitting: Internal Medicine

## 2011-10-26 VITALS — BP 114/62 | HR 86 | Temp 98.4°F | Ht 65.0 in | Wt 192.8 lb

## 2011-10-26 DIAGNOSIS — R05 Cough: Secondary | ICD-10-CM

## 2011-10-26 NOTE — Patient Instructions (Addendum)
Glad cough is much better Cough is from sinus drainage, acid reflux, possible asthma,  All of this is working together to cause cyclical cough/LPR cough  #Sinus drainage  - continue netti pot daily  -  continue nasal steroid generic fluticasone inhaler 2 squirts each nostril daily as advised (cma will ensure refill)  #Possible Acid Reflux - Is ok that you stopped your protonix but - continue to avoid colas, spices, cheeses, spirits, red meats, beer, chocolates, fried foods etc.,   - sleep with head end of bed elevated  - eat small frequent meals  - do not go to bed for 3 hours after last meal  #Possible asthma  - methacholine challenge test was indeterminate for asthma  - given improvement in cough with sinus and gerd measures, we will continue to monitor your cough without starting asthma treatment   #Followup - I will see you in 8  weeks.  - cough score at followup; if cough persists then we can try asthma treatment because that breathing test result was indeterminate for asthma - any problems call or come sooner  

## 2011-10-26 NOTE — Progress Notes (Signed)
Subjective:    Patient ID: Kathryn Phillips, female    DOB: [birth year], 61 y.o.   MRN: [id number]  HPI IOV 09/15/2011  61 year old female. Body mass index is 31.85 kg/(m^2).  reports that she has never smoked. She does not have any smokeless tobacco history on file. Judie Petit, MD . ENT is Dr Madelynn Done at [facility]. She works in Chief Financial Officer in Clinical biochemist at [employer]  Referred for chronic cough. Has has 2 year history of sinus problems (polyps, blockage).  Reports chronic cough since Nov 2012. She felt this was due to chronic sinus drainage and had sinus surgery in Nov 2012 but this did not help cough. Says many different types of anti-tussives not helped. Cough worse at night and she needs to sit up to get relief. Describes episodes of cough and each cough episode can last hours. Course complicated by 1 ER visit that resulted in nebulizer Rx at [facility] and dc ? On combivent. Also, took 6 days of prednisone that helped somewhat.  Cough is associated with white thick mucus. Course is stable and unchanged. Cough worst at night, laughing, talking a lot. Cough improved by drinking hot tea or something warm to drink or staying quiet.  She denies GERD. She denies cola intake but drinks 1 coffee daily. Only occassional cheese. Denies wine intake. Denies ACE inhibitor intake.   RSI cough score 35 of total 45 and indicates severe and LPR cough  CXR 08/17/2011 - clear (personally reviewed image and agree with assessment)   Cough is from sinus drainage, acid reflux, possible asthma,  All of this is working together to cause cyclical cough/LPR cough  #Sinus drainage  - start/continue netti pot daily (nurse will show you picture)  - start nasal steroid generic fluticasone inhaler 2 squirts each nostril daily as advised (nurse will do script)  #Possible Acid Reflux  - start protonix 40mg  daily on empty stomach (nurse will do script),  - If protonix expensive, take otc zegerid 20mg  1 capsule  daily on empty stomach  - take diet sheet from [place] - avoid colas, spices, cheeses, spirits, red meats, beer, chocolates, fried foods etc.,  - sleep with head end of bed elevated  - eat small frequent meals  - do not go to bed for 3 hours after last meal  #Possible Asthma  - do methacholine challenge test asap next week (you cannot be pregnant or have heart disease for this test)  -once test done, call us for result review  #Cyclical cough  - please choose 2-3 days and observe complete voice rest - no talking or whispering  - at all times there there is urge to cough, drink water or swallow or sip on throat lozenge  #Followup  - I will see you in 4 weeks.  - cough score at followup  - any problems call or come sooner    OV 10/26/2011  Folowup cough. Methacholine challenge 09/22/11 is inderminat. ( PC20 is 16mg  with 28% drop in fev1).  Cough much better after following sinus control measurs. She has not been too compliant with gerd control measures. RSI cough score now down to 12 (from 35) and reflects 3 fold improvement (see below). Kouffman cough differentiator suggest neurogenic/airway element to residual cough (score 2) as opposed to gerd (score 0).  She prefers to continue sinus drainage control and watch for further improvement. Currently not interested in steopping up gerd control and asthma Rx for residual cough  Dr Gretta Cool Reflux Symptom Index (> 13-15 suggestive of LPR cough) 0 -> 5  =  none ->severe problem 09/15/11 10/26/2011   Hoarseness of problem with voice 2 2  Clearing  Of Throat 5 2*  Excess throat mucus or feeling of post nasal drip 5 2*  Difficulty swallowing food, liquid or tablets 3 1*  Cough after eating or lying down 5 1*  Breathing difficulties or choking episodes 4 0**  Troublesome or annoying cough 5 2*  Sensation of something sticking in throat or lump in throat 4 2**  Heartburn, chest pain, indigestion, or stomach acid coming up 2 0  TOTAL 35 12   Past,  Family, Social reviewed: no change since last visit      Review of Systems  Constitutional: Negative for fever and unexpected weight change.  HENT: Negative for ear pain, nosebleeds, congestion, sore throat, rhinorrhea, sneezing, trouble swallowing, dental problem, postnasal drip and sinus pressure.   Eyes: Negative for redness and itching.  Respiratory: Negative for cough, chest tightness, shortness of breath and wheezing.   Cardiovascular: Negative for palpitations and leg swelling.  Gastrointestinal: Negative for nausea and vomiting.  Genitourinary: Negative for dysuria.  Musculoskeletal: Negative for joint swelling.  Skin: Negative for rash.  Neurological: Negative for headaches.  Hematological: Does not bruise/bleed easily.  Psychiatric/Behavioral: Negative for dysphoric mood. The patient is not nervous/anxious.        Objective:   Physical Exam Vitals reviewed. Constitutional: She is oriented to person, place, and time. She appears well-developed and well-nourished. No distress.  HENT:  Head: Normocephalic and atraumatic.  Right Ear: External ear normal.  Left Ear: External ear normal.  Mouth/Throat: Oropharynx is clear and moist. No oropharyngeal exudate.  Eyes: Conjunctivae and EOM are normal. Pupils are equal, round, and reactive to light. Right eye exhibits no discharge. Left eye exhibits no discharge. No scleral icterus.  Neck: Normal range of motion. Neck supple. No JVD present. No tracheal deviation present. No thyromegaly present.       Post nasal drip - resolved Cardiovascular: Normal rate, regular rhythm, normal heart sounds and intact distal pulses.  Exam reveals no gallop and no friction rub.   No murmur heard. Pulmonary/Chest: Effort normal and breath sounds normal. No respiratory distress. She has no wheezes. She has no rales. She exhibits no tenderness.       Forced expiratory wheeze - abesent this visit Abdominal: Soft. Bowel sounds are normal. She exhibits  no distension and no mass. There is no tenderness. There is no rebound and no guarding.  Musculoskeletal: Normal range of motion. She exhibits no edema and no tenderness.  Lymphadenopathy:    She has no cervical adenopathy.  Neurological: She is alert and oriented to person, place, and time. She has normal reflexes. No cranial nerve deficit. She exhibits normal muscle tone. Coordination normal.  Skin: Skin is warm and dry. No rash noted. She is not diaphoretic. No erythema. No pallor.  Psychiatric: She has a normal mood and affect. Her behavior is normal. Judgment and thought content normal.           Assessment & Plan:

## 2011-10-28 NOTE — Assessment & Plan Note (Signed)
Glad cough is much better Cough is from sinus drainage, acid reflux, possible asthma,  All of this is working together to cause cyclical cough/LPR cough  #Sinus drainage  - continue netti pot daily  -  continue nasal steroid generic fluticasone inhaler 2 squirts each nostril daily as advised (cma will ensure refill)  #Possible Acid Reflux - Is ok that you stopped your protonix but - continue to avoid colas, spices, cheeses, spirits, red meats, beer, chocolates, fried foods etc.,   - sleep with head end of bed elevated  - eat small frequent meals  - do not go to bed for 3 hours after last meal  #Possible asthma  - methacholine challenge test was indeterminate for asthma  - given improvement in cough with sinus and gerd measures, we will continue to monitor your cough without starting asthma treatment   #Followup - I will see you in 8  weeks.  - cough score at followup; if cough persists then we can try asthma treatment because that breathing test result was indeterminate for asthma - any problems call or come sooner

## 2011-12-22 ENCOUNTER — Ambulatory Visit: Payer: Federal, State, Local not specified - PPO | Admitting: Internal Medicine

## 2012-04-13 ENCOUNTER — Ambulatory Visit (HOSPITAL_BASED_OUTPATIENT_CLINIC_OR_DEPARTMENT_OTHER)
Admit: 2012-04-13 | Discharge: 2012-04-13 | Disposition: A | Discharge status: Home / Self Care | Payer: Federal, State, Local not specified - PPO | Attending: Emergency Medicine | Admitting: Emergency Medicine

## 2012-04-13 ENCOUNTER — Emergency Department (HOSPITAL_BASED_OUTPATIENT_CLINIC_OR_DEPARTMENT_OTHER)
Admission: EM | Admit: 2012-04-13 | Discharge: 2012-04-13 | Disposition: A | Discharge status: Home / Self Care | Payer: Federal, State, Local not specified - PPO | Attending: Emergency Medicine | Admitting: Emergency Medicine

## 2012-04-13 ENCOUNTER — Encounter (HOSPITAL_BASED_OUTPATIENT_CLINIC_OR_DEPARTMENT_OTHER): Payer: Self-pay | Admitting: Emergency Medicine

## 2012-04-13 ENCOUNTER — Emergency Department (HOSPITAL_BASED_OUTPATIENT_CLINIC_OR_DEPARTMENT_OTHER): Payer: Federal, State, Local not specified - PPO

## 2012-04-13 DIAGNOSIS — Z8739 Personal history of other diseases of the musculoskeletal system and connective tissue: Secondary | ICD-10-CM | POA: Insufficient documentation

## 2012-04-13 DIAGNOSIS — M25562 Pain in left knee: Secondary | ICD-10-CM

## 2012-04-13 DIAGNOSIS — Z79899 Other long term (current) drug therapy: Secondary | ICD-10-CM | POA: Insufficient documentation

## 2012-04-13 DIAGNOSIS — M25569 Pain in unspecified knee: Secondary | ICD-10-CM | POA: Insufficient documentation

## 2012-04-13 MED ORDER — OXYCODONE-ACETAMINOPHEN 5-325 MG PO TABS
2.0000 | ORAL_TABLET | Freq: Once | ORAL | Status: AC
  Administered 2012-04-13: 2 via ORAL
  Filled 2012-04-13 (×2): qty 2

## 2012-04-13 MED ORDER — OXYCODONE-ACETAMINOPHEN 5-325 MG PO TABS: 1.0000 | ORAL_TABLET | Freq: Four times a day (QID) | ORAL | Status: DC | PRN

## 2012-04-13 MED ORDER — ACETAMINOPHEN 325 MG PO TABS
ORAL_TABLET | ORAL | Status: AC
  Filled 2012-04-13: qty 2

## 2012-04-13 NOTE — ED Notes (Signed)
Pt reports severe knee pain to left knee, pt can ambulate on left leg, However tonight when she went to bed her knee pain got severe

## 2012-04-13 NOTE — ED Provider Notes (Signed)
History     CSN: [id number]  Arrival date & time 04/13/12  0107   First MD Initiated Contact with Patient 04/13/12 0122      Chief Complaint  Patient presents with  . Leg Pain    (Consider location/radiation/quality/duration/timing/severity/associated sxs/prior treatment) HPI Comments: Patient with three week history of left knee pain.  She denies any injury or trauma.  There is pain in the front of the knee and back of the knee that radiates into the calf.  Patient is a 61 y.o. female presenting with leg pain. The history is provided by the patient.  Leg Pain  Incident onset: 3 weeks ago. Incident location: no injury. There was no injury mechanism. The pain is present in the left knee. The pain is severe. The pain has been constant since onset. Pertinent negatives include no numbness. She reports no foreign bodies present. The symptoms are aggravated by activity, bearing weight and palpation. She has tried NSAIDs for the symptoms. The treatment provided no relief.    Past Medical History  Diagnosis Date  . BACK PAIN, UPPER 02/18/2009  . Cough     Past Surgical History  Procedure Date  . Cesarean section   . Myonectomy   . Shoulder arthroscopy w/ rotator cuff repair     Family History  Problem Relation Age of Onset  . Diabetes Father     History  Substance Use Topics  . Smoking status: Never Smoker   . Smokeless tobacco: Not on file  . Alcohol Use: No    OB History    Grav Para Term Preterm Abortions TAB SAB Ect Mult Living                  Review of Systems  Neurological: Negative for numbness.  All other systems reviewed and are negative.    Allergies  Hydrocodone  Home Medications   Current Outpatient Rx  Name  Route  Sig  Dispense  Refill  . ACETAMINOPHEN 100 MG/ML PO SOLN   Oral   Take 10 mg/kg by mouth every 4 (four) hours as needed. Patient used this medication for pain.         . ALBUTEROL SULFATE HFA 108 (90 BASE) MCG/ACT IN AERS  Inhalation   Inhale 2 puffs into the lungs every 6 (six) hours as needed.         Maximino Greenland 18-103 MCG/ACT IN AERO   Inhalation   Inhale 2 puffs into the lungs every 6 (six) hours as needed for wheezing.   1 Inhaler   0   . FLUTICASONE PROPIONATE 50 MCG/ACT NA SUSP   Nasal   Place 2 sprays into the nose daily.   16 g   6   . IBUPROFEN 200 MG PO TABS   Oral   Take 400 mg by mouth every 6 (six) hours as needed. Patient used this medication for a headache.         Marland Kitchen PANTOPRAZOLE SODIUM 40 MG PO TBEC   Oral   Take 1 tablet (40 mg total) by mouth daily.   30 tablet   6     BP 120/69  Pulse 84  Resp 18  Ht 5\' 5"  (1.651 m)  Wt 178 lb (80.74 kg)  BMI 29.62 kg/m2  SpO2 99%  Physical Exam  Nursing note and vitals reviewed. Constitutional: She is oriented to person, place, and time. She appears well-developed and well-nourished. No distress.  HENT:  Head: Normocephalic and atraumatic.  Mouth/Throat:  Oropharynx is clear and moist.  Neck: Normal range of motion. Neck supple.  Musculoskeletal:       The left knee has a small effusion but otherwise appears grossly normal.  There is no warmth or erythema.  There is ttp in the popliteal region but no palpable cord.  Homans sign is absent.  Distal pulses, motor, and sensation are intact.  There is pain with range of motion but no crepitus.  Neurological: She is alert and oriented to person, place, and time.  Skin: Skin is warm and dry. She is not diaphoretic.    ED Course  Procedures (including critical care time)  Labs Reviewed - No data to display No results found.   No diagnosis found.    MDM  The patient presents with worsening pain in the knee and back of the leg for the past three weeks in the absence of injury or trauma.  At the top of the differential are djd and dvt.  The xrays show mild degenerative changes and [place] is unavailable as they have left for the night.  I will have her return in the am  for a dvt study.  She will be instructed that if the study is negative, she should follow up with her pcp to discuss orthopedic follow up.  I will recommend nsaids and prescribe pain medications.          Geoffery Lyons, MD 04/13/12 [phone]

## 2013-02-03 ENCOUNTER — Encounter (HOSPITAL_BASED_OUTPATIENT_CLINIC_OR_DEPARTMENT_OTHER): Payer: Self-pay | Admitting: *Deleted

## 2013-02-03 ENCOUNTER — Emergency Department (HOSPITAL_BASED_OUTPATIENT_CLINIC_OR_DEPARTMENT_OTHER)
Admission: EM | Admit: 2013-02-03 | Discharge: 2013-02-03 | Disposition: A | Discharge status: Home / Self Care | Payer: Federal, State, Local not specified - PPO | Attending: Emergency Medicine | Admitting: Emergency Medicine

## 2013-02-03 DIAGNOSIS — Z79899 Other long term (current) drug therapy: Secondary | ICD-10-CM | POA: Insufficient documentation

## 2013-02-03 DIAGNOSIS — R252 Cramp and spasm: Secondary | ICD-10-CM | POA: Insufficient documentation

## 2013-02-03 DIAGNOSIS — M79605 Pain in left leg: Secondary | ICD-10-CM

## 2013-02-03 LAB — CBC WITH DIFFERENTIAL/PLATELET
Basophils Relative: 0 % (ref 0–1)
Eosinophils Absolute: 0.2 10*3/uL (ref 0.0–0.7)
Eosinophils Relative: 4 % (ref 0–5)
MCH: 28.2 pg (ref 26.0–34.0)
MCHC: 33.1 g/dL (ref 30.0–36.0)
MCV: 85.2 fL (ref 78.0–100.0)
Monocytes Relative: 10 % (ref 3–12)
Neutrophils Relative %: 44 % (ref 43–77)
Platelets: 237 10*3/uL (ref 150–400)

## 2013-02-03 LAB — COMPREHENSIVE METABOLIC PANEL
Albumin: 3.9 g/dL (ref 3.5–5.2)
Alkaline Phosphatase: 95 U/L (ref 39–117)
BUN: 16 mg/dL (ref 6–23)
Calcium: 9.9 mg/dL (ref 8.4–10.5)
GFR calc Af Amer: >90 mL/min (ref >90)
Glucose, Bld: 107 mg/dL — ABNORMAL HIGH (ref 70–99)
Potassium: 3.7 mEq/L (ref 3.5–5.1)
Sodium: 140 mEq/L (ref 135–145)
Total Protein: 6.7 g/dL (ref 6.0–8.3)

## 2013-02-03 LAB — CK: Total CK: 393 U/L — ABNORMAL HIGH (ref 7–177)

## 2013-02-03 LAB — D-DIMER, QUANTITATIVE: D-Dimer, Quant: <0.27 ug/mL-FEU (ref 0.00–0.48)

## 2013-02-03 MED ORDER — IBUPROFEN 800 MG PO TABS: 800.0000 mg | ORAL_TABLET | Freq: Three times a day (TID) | ORAL | Status: DC

## 2013-02-03 NOTE — ED Notes (Signed)
Pt c/o left thigh and lower leg cramping x 1 day

## 2013-02-03 NOTE — ED Provider Notes (Signed)
CSN: [id number]     Arrival date & time 02/03/13  1735 History  This chart was scribed for Kathryn Octave, MD by Ardelia Mems, ED Scribe. This patient was seen in room MH10/MH10 and the patient's care was started at 5:46 PM.   Chief Complaint  Patient presents with  . Leg Pain    The history is provided by the patient. No language interpreter was used.    HPI Comments: Kathryn Phillips is a 62 y.o. female who presents to the Emergency Department complaining of intermittent, severe "spasmic" left leg pain that woke her from sleep this morning. She states that the pain is mostly in her left thigh, but that it is also present in her left lower leg. She states that this morning, for several hours, she had this pain intermittently in 10-15 minute episodes. She states she believes her spasmic pain has subsided, and she currently reports pain described as "soreness" in her left leg. She denies numbness, weakness or tingling in her left leg. She denies pain in her right leg. She states that she has a history of similar pain in the past, and she received a negative [place] in 12/13 when she was seen for the same. She states that her PCP, Dr. Cato Mulligan, is not aware of this problem. She states that she has been eating and drinking normally. She denies recent long distance travel or recent antibiotic use. She denies fever, emesis, SOB, chest pain, abdominal pain, back pain or any other symptoms. She denies smoking or alcohol use.  PCP- Dr. Birdie Sons   Past Medical History  Diagnosis Date  . BACK PAIN, UPPER 02/18/2009  . Cough    Past Surgical History  Procedure Laterality Date  . Cesarean section    . Myonectomy    . Shoulder arthroscopy w/ rotator cuff repair     Family History  Problem Relation Age of Onset  . Diabetes Father    History  Substance Use Topics  . Smoking status: Never Smoker   . Smokeless tobacco: Not on file  . Alcohol Use: No   OB History   Grav Para Term Preterm Abortions  TAB SAB Ect Mult Living                 Review of Systems A complete 10 system review of systems was obtained and all systems are negative except as noted in the HPI and PMH.   Allergies  Hydrocodone  Home Medications   Current Outpatient Rx  Name  Route  Sig  Dispense  Refill  . acetaminophen (TYLENOL) 100 MG/ML solution   Oral   Take 10 mg/kg by mouth every 4 (four) hours as needed. Patient used this medication for pain.         Marland Kitchen albuterol (PROVENTIL HFA) 108 (90 BASE) MCG/ACT inhaler   Inhalation   Inhale 2 puffs into the lungs every 6 (six) hours as needed.         Marland Kitchen ibuprofen (ADVIL,MOTRIN) 200 MG tablet   Oral   Take 400 mg by mouth every 6 (six) hours as needed. Patient used this medication for a headache.         . ibuprofen (ADVIL,MOTRIN) 800 MG tablet   Oral   Take 1 tablet (800 mg total) by mouth 3 (three) times daily.   21 tablet   0   . oxyCODONE-acetaminophen (PERCOCET/ROXICET) 5-325 MG per tablet   Oral   Take 1-2 tablets by mouth every 6 (six) hours  as needed for pain.   20 tablet   0    Triage Vitals: BP 121/79  Pulse 91  Temp(Src) 98.5 F (36.9 C) (Oral)  Resp 16  Ht 5\' 6"  (1.676 m)  Wt 180 lb (81.647 kg)  BMI 29.07 kg/m2  SpO2 99%  Physical Exam  Nursing note and vitals reviewed. Constitutional: She is oriented to person, place, and time. She appears well-developed and well-nourished. No distress.  HENT:  Head: Normocephalic and atraumatic.  Eyes: EOM are normal.  Neck: Neck supple. No tracheal deviation present.  Cardiovascular: Normal rate.   Pulmonary/Chest: Effort normal. No respiratory distress.  Musculoskeletal: Normal range of motion. She exhibits no edema and no tenderness.  Full ROM of left hip, knee and ankle. Intact DP and PT pulse. 5/5 strength throughout. Distal sensation intact. No calf swelling or tenderness.   Neurological: She is alert and oriented to person, place, and time.  Skin: Skin is warm and dry.  No  lacerations or abrasions on skin.  Psychiatric: She has a normal mood and affect. Her behavior is normal.    ED Course  Procedures (including critical care time)  DIAGNOSTIC STUDIES: Oxygen Saturation is 99% on RA, normal by my interpretation.    COORDINATION OF CARE: 5:55 PM- Discussed plan to obtain diagnostic lab work. Also discussed plan for a fluid challenge. Pt advised of plan for treatment and pt agrees.  Labs Review Labs Reviewed  CBC WITH DIFFERENTIAL - Abnormal; Notable for the following:    Hemoglobin 11.8 (*)    HCT 35.6 (*)    All other components within normal limits  COMPREHENSIVE METABOLIC PANEL - Abnormal; Notable for the following:    Glucose, Bld 107 (*)    All other components within normal limits  CK - Abnormal; Notable for the following:    Total CK 393 (*)    All other components within normal limits  MAGNESIUM  D-DIMER, QUANTITATIVE   Imaging Review No results found.  MDM   1. Leg cramps    Left leg cramping and soreness for the past day. Recurrent problem that she's had in the past. No chest pain or shortness of breath. No trauma. No weakness, numbness or tingling. electrolytes unremarkable. D-dimer negative. CK mildly elevated at 393.  Doubt DVT but will have patient return for [place] tomorrow. Encourage hydration, NSAIDs, followup with PCP.  I personally performed the services described in this documentation, which was scribed in my presence. The recorded information has been reviewed and is accurate.   Kathryn Octave, MD 02/03/13 [phone]

## 2013-02-04 ENCOUNTER — Ambulatory Visit (HOSPITAL_BASED_OUTPATIENT_CLINIC_OR_DEPARTMENT_OTHER)
Admit: 2013-02-04 | Discharge: 2013-02-04 | Disposition: A | Discharge status: Home / Self Care | Payer: Federal, State, Local not specified - PPO | Attending: Emergency Medicine | Admitting: Emergency Medicine

## 2013-02-04 DIAGNOSIS — M79605 Pain in left leg: Secondary | ICD-10-CM

## 2013-02-04 DIAGNOSIS — M79609 Pain in unspecified limb: Secondary | ICD-10-CM | POA: Insufficient documentation

## 2013-05-02 HISTORY — PX: COLONOSCOPY: SHX174

## 2013-05-31 ENCOUNTER — Encounter: Payer: Self-pay | Admitting: Internal Medicine

## 2013-07-25 ENCOUNTER — Encounter: Payer: Self-pay | Admitting: Internal Medicine

## 2013-09-12 ENCOUNTER — Ambulatory Visit (AMBULATORY_SURGERY_CENTER): Payer: Self-pay

## 2013-09-12 VITALS — Ht 66.0 in | Wt 190.0 lb

## 2013-09-12 DIAGNOSIS — Z1211 Encounter for screening for malignant neoplasm of colon: Secondary | ICD-10-CM

## 2013-09-12 MED ORDER — MOVIPREP 100 G PO SOLR: 1.0000 | Freq: Once | ORAL | Status: DC

## 2013-09-12 NOTE — Progress Notes (Signed)
No allergies to eggs or soy No diet/weight loss meds No home oxygen No past problems with anesthesia  Has email  Emmi instructions given for colonoscopy 

## 2013-09-25 ENCOUNTER — Encounter: Payer: Self-pay | Admitting: Internal Medicine

## 2013-09-25 ENCOUNTER — Ambulatory Visit (AMBULATORY_SURGERY_CENTER): Payer: Federal, State, Local not specified - PPO | Admitting: Internal Medicine

## 2013-09-25 VITALS — BP 119/78 | HR 75 | Temp 97.6°F | Resp 21 | Ht 66.0 in | Wt 190.0 lb

## 2013-09-25 DIAGNOSIS — Z1211 Encounter for screening for malignant neoplasm of colon: Secondary | ICD-10-CM

## 2013-09-25 MED ORDER — SODIUM CHLORIDE 0.9 % IV SOLN: 500.0000 mL | INTRAVENOUS | Status: DC

## 2013-09-25 NOTE — Progress Notes (Signed)
A/ox3, pleased with MAC, report to RN 

## 2013-09-25 NOTE — Progress Notes (Signed)
No problems noted in the recovery room. maw 

## 2013-09-25 NOTE — Op Note (Signed)
[place]  [address] [place], [id number]   COLONOSCOPY PROCEDURE REPORT  PATIENT: Kathryn Phillips, Kathryn Phillips  MR#: [id number] BIRTHDATE: 1950/06/12 , 62  yrs. old GENDER: Female ENDOSCOPIST: Lafayette Dragon, MD REFERRED NT:[id number] Swords, M.D. PROCEDURE DATE:  09/25/2013 PROCEDURE:   Colonoscopy, screening First Screening Colonoscopy - Avg.  risk and is 50 yrs.  old or older - No.  Prior Negative Screening - Now for repeat screening. 10 or more years since last screening  History of Adenoma - Now for follow-up colonoscopy & has been > or = to 3 yrs.  N/A  Polyps Removed Today? No.  Recommend repeat exam, <10 yrs? No. ASA CLASS:   Class II INDICATIONS:Average risk patient for colon cancer and loss colonoscopy in March 2005 was normal. MEDICATIONS: MAC sedation, administered by CRNA and Propofol (Diprivan) 240 mg IV  DESCRIPTION OF PROCEDURE:   After the risks benefits and alternatives of the procedure were thoroughly explained, informed consent was obtained.  A digital rectal exam revealed no abnormalities of the rectum.   The LB [id number]  endoscope was introduced through the anus and advanced to the cecum, which was identified by both the appendix and ileocecal valve. No adverse events experienced.   The quality of the prep was excellent, using MoviPrep  The instrument was then slowly withdrawn as the colon was fully examined.      COLON FINDINGS: A normal appearing cecum, ileocecal valve, and appendiceal orifice were identified.  The ascending, hepatic flexure, transverse, splenic flexure, descending, sigmoid colon and rectum appeared unremarkable.  No polyps or cancers were seen. Retroflexed views revealed no abnormalities. The time to cecum=9 minutes 00 seconds.  Withdrawal time=6 minutes 32 seconds.  The scope was withdrawn and the procedure completed. COMPLICATIONS: There were no complications.  ENDOSCOPIC IMPRESSION: Normal  colon  RECOMMENDATIONS: high fiber diet Recall colonoscopy in 10 years   eSigned:  Lafayette Dragon, MD 09/25/2013 10:02 AM   cc:

## 2013-09-25 NOTE — Patient Instructions (Signed)
YOU HAD AN ENDOSCOPIC PROCEDURE TODAY AT THE [facility]: Refer to the procedure report that was given to you for any specific questions about what was found during the examination.  If the procedure report does not answer your questions, please call your gastroenterologist to clarify.  If you requested that your care partner not be given the details of your procedure findings, then the procedure report has been included in a sealed envelope for you to review at your convenience later.  YOU SHOULD EXPECT: Some feelings of bloating in the abdomen. Passage of more gas than usual.  Walking can help get rid of the air that was put into your GI tract during the procedure and reduce the bloating. If you had a lower endoscopy (such as a colonoscopy or flexible sigmoidoscopy) you may notice spotting of blood in your stool or on the toilet paper. If you underwent a bowel prep for your procedure, then you may not have a normal bowel movement for a few days.  DIET: Your first meal following the procedure should be a light meal and then it is ok to progress to your normal diet.  A half-sandwich or bowl of soup is an example of a good first meal.  Heavy or fried foods are harder to digest and may make you feel nauseous or bloated.  Likewise meals heavy in dairy and vegetables can cause extra gas to form and this can also increase the bloating.  Drink plenty of fluids but you should avoid alcoholic beverages for 24 hours.  ACTIVITY: Your care partner should take you home directly after the procedure.  You should plan to take it easy, moving slowly for the rest of the day.  You can resume normal activity the day after the procedure however you should NOT DRIVE or use heavy machinery for 24 hours (because of the sedation medicines used during the test).    SYMPTOMS TO REPORT IMMEDIATELY: A gastroenterologist can be reached at any hour.  During normal business hours, 8:30 AM to 5:00 PM Monday through Friday,  call [phone].  After hours and on weekends, please call the GI answering service at [phone] who will take a message and have the physician on call contact you.   Following lower endoscopy (colonoscopy or flexible sigmoidoscopy):  Excessive amounts of blood in the stool  Significant tenderness or worsening of abdominal pains  Swelling of the abdomen that is new, acute  Fever of 100F or higher   FOLLOW UP: If any biopsies were taken you will be contacted by phone or by letter within the next 1-3 weeks.  Call your gastroenterologist if you have not heard about the biopsies in 3 weeks.  Our staff will call the home number listed on your records the next business day following your procedure to check on you and address any questions or concerns that you may have at that time regarding the information given to you following your procedure. This is a courtesy call and so if there is no answer at the home number and we have not heard from you through the emergency physician on call, we will assume that you have returned to your regular daily activities without incident.  SIGNATURES/CONFIDENTIALITY: You and/or your care partner have signed paperwork which will be entered into your electronic medical record.  These signatures attest to the fact that that the information above on your After Visit Summary has been reviewed and is understood.  Full responsibility of the confidentiality of   this discharge information lies with you and/or your care-partner.    Handout was given to your care partner on a high fiber diet with liberal fluid intake. Yo u may resume your current medications today.. Please call if any questions or concerns.

## 2013-09-26 ENCOUNTER — Telehealth: Payer: Self-pay | Admitting: *Deleted

## 2013-09-26 NOTE — Telephone Encounter (Signed)
No answer, left message to call if questions or concerns. 

## 2014-06-04 ENCOUNTER — Other Ambulatory Visit: Payer: Self-pay | Admitting: Obstetrics and Gynecology

## 2014-06-04 DIAGNOSIS — Z1231 Encounter for screening mammogram for malignant neoplasm of breast: Secondary | ICD-10-CM

## 2014-06-13 ENCOUNTER — Ambulatory Visit
Admission: RE | Admit: 2014-06-13 | Discharge: 2014-06-13 | Disposition: A | Discharge status: Home / Self Care | Payer: Federal, State, Local not specified - PPO | Source: Ambulatory Visit | Attending: Obstetrics and Gynecology | Admitting: Obstetrics and Gynecology

## 2014-06-13 DIAGNOSIS — Z1231 Encounter for screening mammogram for malignant neoplasm of breast: Secondary | ICD-10-CM

## 2016-04-18 ENCOUNTER — Ambulatory Visit: Payer: Federal, State, Local not specified - PPO | Admitting: Family Medicine

## 2017-03-06 ENCOUNTER — Emergency Department (HOSPITAL_BASED_OUTPATIENT_CLINIC_OR_DEPARTMENT_OTHER)
Admission: EM | Admit: 2017-03-06 | Discharge: 2017-03-06 | Disposition: A | Discharge status: Home / Self Care | Payer: Federal, State, Local not specified - PPO | Attending: Emergency Medicine | Admitting: Emergency Medicine

## 2017-03-06 ENCOUNTER — Emergency Department (HOSPITAL_BASED_OUTPATIENT_CLINIC_OR_DEPARTMENT_OTHER): Payer: Federal, State, Local not specified - PPO

## 2017-03-06 ENCOUNTER — Encounter (HOSPITAL_BASED_OUTPATIENT_CLINIC_OR_DEPARTMENT_OTHER): Payer: Self-pay | Admitting: *Deleted

## 2017-03-06 ENCOUNTER — Other Ambulatory Visit: Payer: Self-pay

## 2017-03-06 DIAGNOSIS — J069 Acute upper respiratory infection, unspecified: Secondary | ICD-10-CM

## 2017-03-06 DIAGNOSIS — J399 Disease of upper respiratory tract, unspecified: Secondary | ICD-10-CM | POA: Diagnosis not present

## 2017-03-06 DIAGNOSIS — R05 Cough: Secondary | ICD-10-CM | POA: Diagnosis present

## 2017-03-06 DIAGNOSIS — R059 Cough, unspecified: Secondary | ICD-10-CM

## 2017-03-06 MED ORDER — PREDNISONE 20 MG PO TABS: 20.0000 mg | ORAL_TABLET | Freq: Every day | ORAL | 0 refills | Status: AC

## 2017-03-06 MED ORDER — BENZONATATE 100 MG PO CAPS: 100.0000 mg | ORAL_CAPSULE | Freq: Three times a day (TID) | ORAL | 0 refills | Status: DC

## 2017-03-06 MED FILL — predniSONE 20 MG TABS: 20 | 5 days supply | Qty: 5 | Fill #0

## 2017-03-06 MED FILL — BENZONATATE 100 MG CAPSULE: 100 | 7 days supply | Qty: 21 | Fill #0

## 2017-03-06 NOTE — ED Provider Notes (Signed)
Emergency Department Provider Note   I have reviewed the triage vital signs and the nursing notes.   HISTORY  Chief Complaint URI   HPI Kathryn Phillips is a 66 y.o. female since to the emergency department for evaluation of URI symptoms and productive cough with green sputum worsening over the past 5-6 days.  Patient has some chest discomfort only with coughing.  None at rest.  She denies any fevers or shaking chills.  She has had some sinus pressure along with runny nose.  She feels that the infection is "going to my chest."  She does not smoke cigarettes.  She has not seen blood in the sputum.  No pleuritic or exertional chest pain.  No abdominal discomfort.  She has no GI symptoms to report.  She is tried over-the-counter cough and cold medications with little to no relief.   Past Medical History:  Diagnosis Date  . BACK PAIN, UPPER 02/18/2009  . Cough     Patient Active Problem List   Diagnosis Date Noted  . 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 Allergic rhinitis 07/19/2010  . BACK PAIN, UPPER 02/18/2009  . ARM PAIN, LEFT 11/07/2007    Past Surgical History:  Procedure Laterality Date  . CESAREAN SECTION    . myonectomy    . NASAL SINUS SURGERY    . SHOULDER ARTHROSCOPY W/ ROTATOR CUFF REPAIR      Current Outpatient Rx  . Order #: [id number] Class: Historical Med  . Order #: [id number] Class: Historical Med  . Order #: [id number] Class: Historical Med  . Order #: [id number] Class: Print  . Order #: [id number] Class: Historical Med  . Order #: [id number] Class: Print  . Order #: [id number] Class: Print    Allergies Hydrocodone  Family History  Problem Relation Age of Onset  . Diabetes Father   . Colon cancer Neg Hx   . Pancreatic cancer Neg Hx   . Rectal cancer Neg Hx   . Stomach cancer Neg Hx     Social History Social History   Tobacco Use  . Smoking status: Never Smoker  . Smokeless tobacco: Never Used  Substance Use Topics  . Alcohol use: Yes  . Drug use: No    Review  of Systems  Constitutional: No fever/chills Eyes: No visual changes. ENT: No sore throat. Positive face pain.  Cardiovascular: Denies chest pain. Respiratory: Denies shortness of breath. Positive cough.  Gastrointestinal: No abdominal pain.  No nausea, no vomiting.  No diarrhea.  No constipation. Genitourinary: Negative for dysuria. Musculoskeletal: Negative for back pain. Skin: Negative for rash. Neurological: Negative for headaches, focal weakness or numbness.  10-point ROS otherwise negative.  ____________________________________________   PHYSICAL EXAM:  VITAL SIGNS: ED Triage Vitals  Enc Vitals Group     BP 03/06/17 1605 114/71     Pulse Rate 03/06/17 1605 75     Resp 03/06/17 1605 18     Temp 03/06/17 1605 97.9 F (36.6 C)     Temp Source 03/06/17 1605 Oral     SpO2 03/06/17 1605 98 %     Weight 03/06/17 1603 190 lb (86.2 kg)     Height 03/06/17 1603 5' 5.5" (1.664 m)     Pain Score 03/06/17 1602 8   Constitutional: Alert and oriented. Well appearing and in no acute distress. Eyes: Conjunctivae are normal. Head: Atraumatic. Nose: Positive congestion/rhinnorhea. Mouth/Throat: Mucous membranes are moist.  Oropharynx with mild erythema. No exudate.  Neck: No stridor.   Cardiovascular: Normal rate, regular rhythm. Good peripheral circulation.  Grossly normal heart sounds.   Respiratory: Normal respiratory effort.  No retractions. Lungs CTAB.  Gastrointestinal: Soft and nontender. No distention.  Musculoskeletal: No lower extremity tenderness nor edema. No gross deformities of extremities. Neurologic:  Normal speech and language. No gross focal neurologic deficits are appreciated.  Skin:  Skin is warm, dry and intact. No rash noted. ____________________________________________  RADIOLOGY  Dg Chest 2 View  Result Date: 03/06/2017 CLINICAL DATA:  Productive cough. EXAM: CHEST  2 VIEW COMPARISON:  Radiographs of August 17, 2011. FINDINGS: The heart size and mediastinal  contours are within normal limits. Both lungs are clear. No pneumothorax or pleural effusion is noted. The visualized skeletal structures are unremarkable. IMPRESSION: No active cardiopulmonary disease. Electronically Signed   By: Marijo Conception, M.D.   On: 03/06/2017 16:23    ____________________________________________   PROCEDURES  Procedure(s) performed:   Procedures  None ____________________________________________   INITIAL IMPRESSION / ASSESSMENT AND PLAN / ED COURSE  Pertinent labs & imaging results that were available during my care of the patient were reviewed by me and considered in my medical decision making (see chart for details).  Returns to the emergency department for evaluation of cough with face pressure and URI symptoms.  She is in no acute distress.  No hypoxemia.  Given prolonged symptoms with productive cough plan for chest x-ray to rule out pneumonia.  Suspect bronchitis.   04:33 PM Patient with normal chest x-ray.  Plan for discharge with brief steroid burst and Tessalon Perles for cough. Suspect viral infection. No abx at this time. Plan also for close PCP follow up.    At this time, I do not feel there is any life-threatening condition present. I have reviewed and discussed all results (EKG, imaging, lab, urine as appropriate), exam findings with patient. I have reviewed nursing notes and appropriate previous records.  I feel the patient is safe to be discharged home without further emergent workup. Discussed usual and customary return precautions. Patient and family (if present) verbalize understanding and are comfortable with this plan.  Patient will follow-up with their primary care provider. If they do not have a primary care provider, information for follow-up has been provided to them. All questions have been answered.  ____________________________________________  FINAL CLINICAL IMPRESSION(S) / ED DIAGNOSES  Final diagnoses:  Viral upper  respiratory tract infection  Cough     MEDICATIONS GIVEN DURING THIS VISIT:  Medications - No data to display   NEW OUTPATIENT MEDICATIONS STARTED DURING THIS VISIT:  Prednisone 20 mg daily x 5 days Tessalon    Note:  This document was prepared using Dragon voice recognition software and may include unintentional dictation errors.  Nanda Quinton, MD Emergency Medicine    Akiera Allbaugh, [facility], MD 03/06/17 [phone]

## 2017-03-06 NOTE — Discharge Instructions (Signed)

## 2017-03-06 NOTE — ED Triage Notes (Signed)
Cough with thick green sputum. Runny nose.

## 2017-03-06 NOTE — ED Notes (Signed)
In to see Pt. She is not in room

## 2018-01-26 ENCOUNTER — Ambulatory Visit: Payer: Federal, State, Local not specified - PPO | Admitting: Family Medicine

## 2018-02-07 ENCOUNTER — Encounter: Payer: Self-pay | Admitting: Family Medicine

## 2018-02-07 ENCOUNTER — Ambulatory Visit: Payer: Federal, State, Local not specified - PPO | Admitting: Family Medicine

## 2018-02-07 VITALS — BP 124/80 | HR 78 | Temp 98.4°F | Resp 12 | Ht 65.5 in | Wt 191.0 lb

## 2018-02-07 DIAGNOSIS — R519 Headache, unspecified: Secondary | ICD-10-CM

## 2018-02-07 DIAGNOSIS — R51 Headache: Secondary | ICD-10-CM

## 2018-02-07 DIAGNOSIS — J309 Allergic rhinitis, unspecified: Secondary | ICD-10-CM | POA: Diagnosis not present

## 2018-02-07 DIAGNOSIS — M542 Cervicalgia: Secondary | ICD-10-CM | POA: Diagnosis not present

## 2018-02-07 MED ORDER — BACLOFEN 10 MG PO TABS: 10.0000 mg | ORAL_TABLET | Freq: Every day | ORAL | 0 refills | Status: DC

## 2018-02-07 NOTE — Progress Notes (Signed)
ACUTE VISIT   HPI:  Chief Complaint  Patient presents with  . Migraine    Ms.Kathryn Phillips is a 67 y.o. female, who is here today complaining of headaches, which she has had for several months but more frequent for the past 6-8 weeks, morning. Achy, throbbing of frontal headache, wakes her up. "Bad headache", 10/10.  Denies associated fever,chills,visual changes,nausea,vomiting,or focal neurologic deficit.  She has taken Tylenol. States that she has to wear eye glasses more frequent now.  No Hx of OSA. Hx of allergic rhinitis, started with nasal congestion,rhinorrhea, and post nasal drainage.  Neck pain,intermittent.  She has been under more stress than usual. Pain radiated to right UE. Problem has been intermittent for 6 months. She has not identified exacerbating or alleviating factors.  Stable.  Cervical CT in 01/2009: Central disc protrusion and spinal stenosis at C4-5.  Moderate spinal stenosis at C5-6 due to spondylosis and a left paracentral disc protrusion.  Mild spinal stenosis at C6-7 due to spondylosis.   Review of Systems  Constitutional: Positive for fatigue. Negative for activity change, appetite change and fever.  HENT: Negative for mouth sores, nosebleeds and trouble swallowing.   Eyes: Negative for redness and visual disturbance.  Respiratory: Negative for cough, shortness of breath and wheezing.   Cardiovascular: Negative for chest pain, palpitations and leg swelling.  Gastrointestinal: Negative for abdominal pain, nausea and vomiting.       Negative for changes in bowel habits.  Genitourinary: Negative for decreased urine volume, difficulty urinating, dysuria and hematuria.  Musculoskeletal: Positive for neck pain. Negative for gait problem.  Neurological: Positive for headaches. Negative for syncope, weakness and numbness.  Psychiatric/Behavioral: Negative for confusion. The patient is nervous/anxious.       Current Outpatient  Medications on File Prior to Visit  Medication Sig Dispense Refill  . acetaminophen (TYLENOL) 100 MG/ML solution Take 10 mg/kg by mouth every 4 (four) hours as needed. Patient used this medication for pain.    . Multiple Vitamin (MULTIVITAMIN) tablet Take 1 tablet by mouth daily.     No current facility-administered medications on file prior to visit.      Past Medical History:  Diagnosis Date  . BACK PAIN, UPPER 02/18/2009  . Cough    Allergies  Allergen Reactions  . Hydrocodone Nausea And Vomiting    Social History   Socioeconomic History  . Marital status: Married    Spouse name: Not on file  . Number of children: Not on file  . Years of education: Not on file  . Highest education level: Not on file  Occupational History  . Not on file  Social Needs  . Financial resource strain: Not on file  . Food insecurity:    Worry: Not on file    Inability: Not on file  . Transportation needs:    Medical: Not on file    Non-medical: Not on file  Tobacco Use  . Smoking status: Never Smoker  . Smokeless tobacco: Never Used  Substance and Sexual Activity  . Alcohol use: Yes  . Drug use: No  . Sexual activity: Not on file  Lifestyle  . Physical activity:    Days per week: Not on file    Minutes per session: Not on file  . Stress: Not on file  Relationships  . Social connections:    Talks on phone: Not on file    Gets together: Not on file    Attends religious service: Not on  file    Active member of club or organization: Not on file    Attends meetings of clubs or organizations: Not on file    Relationship status: Not on file  Other Topics Concern  . Not on file  Social History Narrative  . Not on file    Vitals:   02/07/18 1612  BP: 124/80  Pulse: 78  Resp: 12  Temp: 98.4 F (36.9 C)  SpO2: 98%   Body mass index is 31.3 kg/m.    Physical Exam  Nursing note and vitals reviewed. Constitutional: She is oriented to person, place, and time. She appears  well-developed. No distress.  HENT:  Head: Normocephalic and atraumatic.  Nose: Right sinus exhibits no maxillary sinus tenderness and no frontal sinus tenderness. Left sinus exhibits no maxillary sinus tenderness and no frontal sinus tenderness.  Mouth/Throat: Oropharynx is clear and moist and mucous membranes are normal.  Hypertrophic turbinates.  Eyes: Pupils are equal, round, and reactive to light. Conjunctivae are normal.  Cardiovascular: Normal rate and regular rhythm.  No murmur heard. Pulses:      Dorsalis pedis pulses are 2+ on the right side, and 2+ on the left side.  Respiratory: Effort normal and breath sounds normal. No respiratory distress.  GI: Soft. She exhibits no mass. There is no hepatomegaly. There is no tenderness.  Musculoskeletal: She exhibits no edema.       Right shoulder: She exhibits normal range of motion and no tenderness.       Cervical back: She exhibits tenderness. She exhibits no bony tenderness.       Back:  Lymphadenopathy:    She has no cervical adenopathy.  Neurological: She is alert and oriented to person, place, and time. She has normal strength. No cranial nerve deficit. Gait normal.  Reflex Scores:      Bicep reflexes are 2+ on the right side and 2+ on the left side.      Patellar reflexes are 2+ on the right side and 2+ on the left side. Skin: Skin is warm. No rash noted. No erythema.  Psychiatric: She has a normal mood and affect.  Well groomed, good eye contact.      ASSESSMENT AND PLAN:   Ms. Kathryn Phillips was seen today for migraine.  Diagnoses and all orders for this visit:  Lab Results  Component Value Date   WBC 4.7 02/07/2018   HGB 12.6 02/07/2018   HCT 37.8 02/07/2018   MCV 85.0 02/07/2018   PLT 246.0 02/07/2018   Lab Results  Component Value Date   CREATININE 0.78 02/07/2018   BUN 13 02/07/2018   NA 140 02/07/2018   K 3.5 02/07/2018   CL 105 02/07/2018   CO2 26 02/07/2018     Headache, unspecified headache  type  Possible causes discussed. ? Tension headache vs migraine. I do not think imaging is needed today. Recommend arranging eye exam. Instructed about warning signs.   -     Basic metabolic panel -     CBC  Neck pain  Chronic. ? With radiculopathy. Side effects of muscle relaxants discussed. ROM neck exercises.   -     baclofen (LIORESAL) 10 MG tablet; Take 1 tablet (10 mg total) by mouth at bedtime.  Allergic rhinitis, unspecified seasonality, unspecified trigger  Problem could contribute to frontal headache. Nasal irrigations with saline several times per day recommended.    Return in about 4 weeks (around 03/07/2018) for 4-6 wks with PCP HA.    Inez Catalina  G. Martinique, MD  [facility]. [place] office.

## 2018-02-07 NOTE — Patient Instructions (Addendum)
A few things to remember from today's visit:   Headache, unspecified headache type - Plan: Basic metabolic panel, CBC  Neck pain - Plan: baclofen (LIORESAL) 10 MG tablet  Allergic rhinitis, unspecified seasonality, unspecified trigger  Please arrange eye exam. Saline nasal irrigations several times per day also recommended, this will help with nasal congestion and runny nose.    General Headache Without Cause A headache is pain or discomfort felt around the head or neck area. There are many causes and types of headaches. In some cases, the cause may not be found. Follow these instructions at home: Managing pain  Take over-the-counter and prescription medicines only as told by your doctor.  Lie down in a dark, quiet room when you have a headache.  If directed, apply ice to the head and neck area: ? Put ice in a plastic bag. ? Place a towel between your skin and the bag. ? Leave the ice on for 20 minutes, 2-3 times per day.  Use a heating pad or hot shower to apply heat to the head and neck area as told by your doctor.  Keep lights dim if bright lights bother you or make your headaches worse. Eating and drinking  Eat meals on a regular schedule.  Lessen how much alcohol you drink.  Lessen how much caffeine you drink, or stop drinking caffeine. General instructions  Keep all follow-up visits as told by your doctor. This is important.  Keep a journal to find out if certain things bring on headaches. For example, write down: ? What you eat and drink. ? How much sleep you get. ? Any change to your diet or medicines.  Relax by getting a massage or doing other relaxing activities.  Lessen stress.  Sit up straight. Do not tighten (tense) your muscles.  Do not use tobacco products. This includes cigarettes, chewing tobacco, or e-cigarettes. If you need help quitting, ask your doctor.  Exercise regularly as told by your doctor.  Get enough sleep. This often means 7-9  hours of sleep. Contact a doctor if:  Your symptoms are not helped by medicine.  You have a headache that feels different than the other headaches.  You feel sick to your stomach (nauseous) or you throw up (vomit).  You have a fever. Get help right away if:  Your headache becomes really bad.  You keep throwing up.  You have a stiff neck.  You have trouble seeing.  You have trouble speaking.  You have pain in the eye or ear.  Your muscles are weak or you lose muscle control.  You lose your balance or have trouble walking.  You feel like you will pass out (faint) or you pass out.  You have confusion. This information is not intended to replace advice given to you by your health care provider. Make sure you discuss any questions you have with your health care provider. Document Released: 01/26/2008 Document Revised: 09/24/2015 Document Reviewed: 08/11/2014 Elsevier Interactive Patient Education  [employer].  Please be sure medication list is accurate. If a new problem present, please set up appointment sooner than planned today.

## 2018-02-08 LAB — CBC
HEMATOCRIT: 37.8 % (ref 36.0–46.0)
Hemoglobin: 12.6 g/dL (ref 12.0–15.0)
MCHC: 33.4 g/dL (ref 30.0–36.0)
MCV: 85 fl (ref 78.0–100.0)
PLATELETS: 246 10*3/uL (ref 150.0–400.0)
RBC: 4.44 Mil/uL (ref 3.87–5.11)
RDW: 13.7 % (ref 11.5–15.5)
WBC: 4.7 10*3/uL (ref 4.0–10.5)

## 2018-02-08 LAB — BASIC METABOLIC PANEL
BUN: 13 mg/dL (ref 6–23)
CHLORIDE: 105 meq/L (ref 96–112)
CO2: 26 mEq/L (ref 19–32)
Calcium: 9.3 mg/dL (ref 8.4–10.5)
Creatinine, Ser: 0.78 mg/dL (ref 0.40–1.20)
GFR: 95.22 mL/min (ref >60.00)
GLUCOSE: 144 mg/dL — AB (ref 70–99)
POTASSIUM: 3.5 meq/L (ref 3.5–5.1)
SODIUM: 140 meq/L (ref 135–145)

## 2018-02-10 ENCOUNTER — Encounter: Payer: Self-pay | Admitting: Family Medicine

## 2018-02-21 ENCOUNTER — Ambulatory Visit: Payer: Federal, State, Local not specified - PPO | Admitting: Family Medicine

## 2018-03-07 ENCOUNTER — Other Ambulatory Visit: Payer: Self-pay | Admitting: Family Medicine

## 2018-03-07 DIAGNOSIS — M542 Cervicalgia: Secondary | ICD-10-CM

## 2018-04-04 ENCOUNTER — Other Ambulatory Visit: Payer: Self-pay | Admitting: *Deleted

## 2018-04-04 DIAGNOSIS — M542 Cervicalgia: Secondary | ICD-10-CM

## 2018-04-04 MED ORDER — BACLOFEN 10 MG PO TABS: 10.0000 mg | ORAL_TABLET | Freq: Every evening | ORAL | 0 refills | Status: AC | PRN

## 2018-10-09 ENCOUNTER — Other Ambulatory Visit (HOSPITAL_COMMUNITY): Payer: Self-pay | Admitting: Oncology

## 2018-10-09 DIAGNOSIS — C50919 Malignant neoplasm of unspecified site of unspecified female breast: Secondary | ICD-10-CM

## 2018-10-17 ENCOUNTER — Encounter (HOSPITAL_COMMUNITY): Payer: Federal, State, Local not specified - PPO

## 2019-04-22 ENCOUNTER — Other Ambulatory Visit: Payer: Self-pay

## 2019-04-23 ENCOUNTER — Other Ambulatory Visit: Payer: Self-pay

## 2019-04-23 ENCOUNTER — Ambulatory Visit (INDEPENDENT_AMBULATORY_CARE_PROVIDER_SITE_OTHER): Payer: Medicare Other | Admitting: Family Medicine

## 2019-04-23 ENCOUNTER — Encounter: Payer: Self-pay | Admitting: Family Medicine

## 2019-04-23 VITALS — BP 126/80 | HR 83 | Resp 16 | Ht 65.5 in | Wt 194.0 lb

## 2019-04-23 DIAGNOSIS — E66811 Obesity, class 1: Secondary | ICD-10-CM

## 2019-04-23 DIAGNOSIS — Z1159 Encounter for screening for other viral diseases: Secondary | ICD-10-CM | POA: Diagnosis not present

## 2019-04-23 DIAGNOSIS — Z6831 Body mass index (BMI) 31.0-31.9, adult: Secondary | ICD-10-CM

## 2019-04-23 DIAGNOSIS — E669 Obesity, unspecified: Secondary | ICD-10-CM

## 2019-04-23 DIAGNOSIS — G4700 Insomnia, unspecified: Secondary | ICD-10-CM

## 2019-04-23 DIAGNOSIS — Z23 Encounter for immunization: Secondary | ICD-10-CM

## 2019-04-23 DIAGNOSIS — R7309 Other abnormal glucose: Secondary | ICD-10-CM

## 2019-04-23 DIAGNOSIS — Z780 Asymptomatic menopausal state: Secondary | ICD-10-CM | POA: Diagnosis not present

## 2019-04-23 DIAGNOSIS — Z9189 Other specified personal risk factors, not elsewhere classified: Secondary | ICD-10-CM

## 2019-04-23 HISTORY — DX: Body mass index (BMI) 31.0-31.9, adult: Z68.31

## 2019-04-23 HISTORY — DX: Insomnia, unspecified: G47.00

## 2019-04-23 HISTORY — DX: Obesity, unspecified: E66.9

## 2019-04-23 LAB — HEMOGLOBIN A1C: Hgb A1c MFr Bld: 6.4 % (ref 4.6–6.5)

## 2019-04-23 LAB — BASIC METABOLIC PANEL
BUN: 12 mg/dL (ref 6–23)
CO2: 28 mEq/L (ref 19–32)
Calcium: 9.1 mg/dL (ref 8.4–10.5)
Chloride: 106 mEq/L (ref 96–112)
Creatinine, Ser: 0.7 mg/dL (ref 0.40–1.20)
GFR: 101.13 mL/min (ref >60.00)
Glucose, Bld: 113 mg/dL — ABNORMAL HIGH (ref 70–99)
Potassium: 4.4 mEq/L (ref 3.5–5.1)
Sodium: 141 mEq/L (ref 135–145)

## 2019-04-23 NOTE — Progress Notes (Addendum)
HPI:   Kathryn Phillips is a 68 y.o. female, who is here today to establish care.  Former PCP: Dr sword. Last preventive routine visit: 2017.  She has not had HCV screening. Overdue for mammogram. Last pap smear in 2017 or 2018. Colonoscopy in 08/2013. Chronic medical problems: Otherwise healthy except for allergies. Negative for Hx of HTN or HLD.  In 01/2018 glucose was elevated at 114. Denies abdominal pain, nausea,vomiting, polydipsia,polyuria, or polyphagia.  In general she follows a healthful diet. Not consistent with regular physical activity.   C/O difficulty falling asleep. She has not identified exacerbating factors. Sleeps about 5 hours. She has taken OTC Melatonin. Most of the time she feels rested when she gets up.   Review of Systems  Constitutional: Negative for activity change, appetite change, fatigue and fever.  HENT: Negative for mouth sores, nosebleeds and sore throat.   Eyes: Negative for redness and visual disturbance.  Respiratory: Negative for cough, shortness of breath and wheezing.   Cardiovascular: Negative for chest pain, palpitations and leg swelling.  Gastrointestinal: Negative for blood in stool.       Negative for changes in bowel habits.  Endocrine: Negative for cold intolerance and heat intolerance.  Genitourinary: Negative for decreased urine volume and hematuria.  Allergic/Immunologic: Positive for environmental allergies.  Neurological: Negative for syncope, weakness and headaches.  Psychiatric/Behavioral: Negative for confusion. The patient is not nervous/anxious.   Rest see pertinent positives and negatives per HPI.   Current Outpatient Medications on File Prior to Visit  Medication Sig Dispense Refill  . acetaminophen (TYLENOL) 100 MG/ML solution Take 10 mg/kg by mouth every 4 (four) hours as needed. Patient used this medication for pain.    . Multiple Vitamin (MULTIVITAMIN) tablet Take 1 tablet by mouth daily.      No current facility-administered medications on file prior to visit.     Past Medical History:  Diagnosis Date  . BACK PAIN, UPPER 02/18/2009  . Cough    Allergies  Allergen Reactions  . Hydrocodone Nausea And Vomiting    Family History  Problem Relation Age of Onset  . Diabetes Father   . Colon cancer Neg Hx   . Pancreatic cancer Neg Hx   . Rectal cancer Neg Hx   . Stomach cancer Neg Hx     Social History   Socioeconomic History  . Marital status: Married    Spouse name: Not on file  . Number of children: Not on file  . Years of education: Not on file  . Highest education level: Not on file  Occupational History  . Not on file  Tobacco Use  . Smoking status: Never Smoker  . Smokeless tobacco: Never Used  Substance and Sexual Activity  . Alcohol use: Yes  . Drug use: No  . Sexual activity: Not on file  Other Topics Concern  . Not on file  Social History Narrative  . Not on file   Social Determinants of Health   Financial Resource Strain:   . Difficulty of Paying Living Expenses: Not on file  Food Insecurity:   . Worried About Charity fundraiser in the Last Year: Not on file  . Ran Out of Food in the Last Year: Not on file  Transportation Needs:   . Lack of Transportation (Medical): Not on file  . Lack of Transportation (Non-Medical): Not on file  Physical Activity:   . Days of Exercise per Week: Not on file  . Minutes  of Exercise per Session: Not on file  Stress:   . Feeling of Stress : Not on file  Social Connections:   . Frequency of Communication with Friends and Family: Not on file  . Frequency of Social Gatherings with Friends and Family: Not on file  . Attends Religious Services: Not on file  . Active Member of Clubs or Organizations: Not on file  . Attends Archivist Meetings: Not on file  . Marital Status: Not on file    Vitals:   04/23/19 1157  BP: 126/80  Pulse: 83  Resp: 16  SpO2: 97%    Body mass index is 31.79  kg/m.  Physical Exam  Nursing note and vitals reviewed. Constitutional: She is oriented to person, place, and time. She appears well-developed. No distress.  HENT:  Head: Normocephalic and atraumatic.  Mouth/Throat: Oropharynx is clear and moist and mucous membranes are normal.  Eyes: Pupils are equal, round, and reactive to light. Conjunctivae are normal.  Cardiovascular: Normal rate and regular rhythm.  No murmur heard. Pulses:      Dorsalis pedis pulses are 2+ on the right side and 2+ on the left side.  Respiratory: Effort normal and breath sounds normal. No respiratory distress.  GI: Soft. She exhibits no mass. There is no hepatomegaly. There is no abdominal tenderness.  Musculoskeletal:        General: No edema.  Lymphadenopathy:    She has no cervical adenopathy.  Neurological: She is alert and oriented to person, place, and time. She has normal strength. No cranial nerve deficit. Gait normal.  Skin: Skin is warm. No rash noted. No erythema.  Psychiatric: She has a normal mood and affect.  Well groomed, good eye contact.    ASSESSMENT AND PLAN:  Kathryn Phillips was seen today for establish care.  Diagnoses and all orders for this visit:  Lab Results  Component Value Date   CREATININE 0.70 04/23/2019   BUN 12 04/23/2019   NA 141 04/23/2019   K 4.4 04/23/2019   CL 106 04/23/2019   CO2 28 04/23/2019   Lab Results  Component Value Date   HGBA1C 6.4 04/23/2019    Elevated glucose level Healthy life style for primary prevention encouraged. Further recommendations according to lab results.  -     Basic Metabolic Panel -     Hemoglobin A1c  Insomnia, unspecified type Good sleep hygiene. She can increase dose of Melatonin from 5 mg to 10 mg and take it 1-2 hours after supper. If problem is not better,we can discussed other treatment options.  Encounter for HCV screening test for high risk patient -     Hepatitis C Antibody  Asymptomatic postmenopausal estrogen  deficiency -     DG Bone Density; Future  Need for influenza vaccination -     Flu Vaccine QUAD High Dose(Fluad)  Need for 23-polyvalent pneumococcal polysaccharide vaccine -     Pneumococcal polysaccharide vaccine 23-valent greater than or equal to 2yo subcutaneous/IM  Class 1 obesity without serious comorbidity with body mass index (BMI) of 31.0 to 31.9 in adult, unspecified obesity type We discussed benefits of wt loss as well as adverse effects of obesity. Consistency with healthy diet and physical activity recommended.    Return for Needs a medicare visit.Marland Kitchen    Betty G. Martinique, MD  [facility]. [place] office.

## 2019-04-23 NOTE — Patient Instructions (Signed)
A few things to remember from today's visit:   Encounter for HCV screening test for high risk patient - Plan: Hepatitis C Antibody  Elevated glucose level - Plan: Basic Metabolic Panel, Hemoglobin A1c  Asymptomatic postmenopausal estrogen deficiency - Plan: DG Bone Density   Please be sure medication list is accurate. If a new problem present, please set up appointment sooner than planned today.

## 2019-04-24 LAB — HEPATITIS C ANTIBODY
Hepatitis C Ab: NONREACTIVE
SIGNAL TO CUT-OFF: 0.01 (ref <1.00)

## 2019-05-08 ENCOUNTER — Ambulatory Visit (INDEPENDENT_AMBULATORY_CARE_PROVIDER_SITE_OTHER): Payer: Medicare Other

## 2019-05-08 ENCOUNTER — Ambulatory Visit (INDEPENDENT_AMBULATORY_CARE_PROVIDER_SITE_OTHER)
Admission: RE | Admit: 2019-05-08 | Discharge: 2019-05-08 | Disposition: A | Discharge status: Home / Self Care | Payer: Medicare Other | Source: Ambulatory Visit | Attending: Family Medicine | Admitting: Family Medicine

## 2019-05-08 ENCOUNTER — Other Ambulatory Visit: Payer: Self-pay

## 2019-05-08 VITALS — BP 120/78 | Temp 97.3°F | Ht 65.5 in | Wt 194.2 lb

## 2019-05-08 DIAGNOSIS — Z0000 Encounter for general adult medical examination without abnormal findings: Secondary | ICD-10-CM

## 2019-05-08 DIAGNOSIS — Z1231 Encounter for screening mammogram for malignant neoplasm of breast: Secondary | ICD-10-CM

## 2019-05-08 DIAGNOSIS — Z780 Asymptomatic menopausal state: Secondary | ICD-10-CM | POA: Diagnosis not present

## 2019-05-08 NOTE — Patient Instructions (Addendum)
Ms. Kathryn Phillips , Thank you for taking time to come for your Medicare Wellness Visit. I appreciate your ongoing commitment to your health goals. Please review the following plan we discussed and let me know if I can assist you in the future.   Screening recommendations/referrals: Colorectal Screening: completed  09/25/2013 due again 09/26/2023. Mammogram: completed in 2017. Due now. Order sent to [facility] and they will contact you to schedule.  Bone Density: scheduled 05/08/2019. Up to date.   Vision and Dental Exams: Recommended annual ophthalmology exams for early detection of glaucoma and other disorders of the eye Recommended annual dental exams for proper oral hygiene  Diabetic Exams: Diabetic Eye Exam: N/A Diabetic Foot Exam: N/A  Vaccinations: Influenza vaccine: completed 04/23/2019. Up to date. Repeat in Fall 2021.  Pneumococcal vaccine: completed 04/23/2019. Tdap vaccine: Expired. Insurance does not pay unless you experience an injury or accident. You may still get it updated in our office however. Shingles vaccine: Please call your pharmacy to determine your out of pocket expense for the Shingrix vaccine. You may receive this vaccine at your local pharmacy. This is a series of two injections to be given 2-6 months apart.  Advanced directives: Advance directives discussed with you today. Advance directives discussed with you today. You have declined to receive documents for completion.  Goals: Recommend to drink at least 6-8 8oz glasses of water per day. Recommend to exercise for at least 150 minutes per week. Eat 3 meals per day. Make time for yourself so you can continue to take care of others.   Next appointment: Please schedule your Annual Wellness Visit with your Nurse Health Advisor in one year.  Preventive Care 67 Years and Older, Female Preventive care refers to lifestyle choices and visits with your health care provider that can promote health and wellness. What  does preventive care include?  A yearly physical exam. This is also called an annual well check.  Dental exams once or twice a year.  Routine eye exams. Ask your health care provider how often you should have your eyes checked.  Personal lifestyle choices, including:  Daily care of your teeth and gums.  Regular physical activity.  Eating a healthy diet.  Avoiding tobacco and drug use.  Limiting alcohol use.  Practicing safe sex.  Taking low-dose aspirin every day if recommended by your health care provider.  Taking vitamin and mineral supplements as recommended by your health care provider. What happens during an annual well check? The services and screenings done by your health care provider during your annual well check will depend on your age, overall health, lifestyle risk factors, and family history of disease. Counseling  Your health care provider may ask you questions about your:  Alcohol use.  Tobacco use.  Drug use.  Emotional well-being.  Home and relationship well-being.  Sexual activity.  Eating habits.  History of falls.  Memory and ability to understand (cognition).  Work and work Statistician.  Reproductive health. Screening  You may have the following tests or measurements:  Height, weight, and BMI.  Blood pressure.  Lipid and cholesterol levels. These may be checked every 5 years, or more frequently if you are over 24 years old.  Skin check.  Lung cancer screening. You may have this screening every year starting at age 14 if you have a 30-pack-year history of smoking and currently smoke or have quit within the past 15 years.  Fecal occult blood test (FOBT) of the stool. You may have this  test every year starting at age 67.  Flexible sigmoidoscopy or colonoscopy. You may have a sigmoidoscopy every 5 years or a colonoscopy every 10 years starting at age 41.  Hepatitis C blood test.  Hepatitis B blood test.  Sexually transmitted  disease (STD) testing.  Diabetes screening. This is done by checking your blood sugar (glucose) after you have not eaten for a while (fasting). You may have this done every 1-3 years.  Bone density scan. This is done to screen for osteoporosis. You may have this done starting at age 30.  Mammogram. This may be done every 1-2 years. Talk to your health care provider about how often you should have regular mammograms. Talk with your health care provider about your test results, treatment options, and if necessary, the need for more tests. Vaccines  Your health care provider may recommend certain vaccines, such as:  Influenza vaccine. This is recommended every year.  Tetanus, diphtheria, and acellular pertussis (Tdap, Td) vaccine. You may need a Td booster every 10 years.  Zoster vaccine. You may need this after age 89.  Pneumococcal 13-valent conjugate (PCV13) vaccine. One dose is recommended after age 77.  Pneumococcal polysaccharide (PPSV23) vaccine. One dose is recommended after age 79. Talk to your health care provider about which screenings and vaccines you need and how often you need them. This information is not intended to replace advice given to you by your health care provider. Make sure you discuss any questions you have with your health care provider. Document Released: 05/15/2015 Document Revised: 01/06/2016 Document Reviewed: 02/17/2015 Elsevier Interactive Patient Education  2017 [place] Prevention in the Home Falls can cause injuries. They can happen to people of all ages. There are many things you can do to make your home safe and to help prevent falls. What can I do on the outside of my home?  Regularly fix the edges of walkways and driveways and fix any cracks.  Remove anything that might make you trip as you walk through a door, such as a raised step or threshold.  Trim any bushes or trees on the path to your home.  Use bright outdoor  lighting.  Clear any walking paths of anything that might make someone trip, such as rocks or tools.  Regularly check to see if handrails are loose or broken. Make sure that both sides of any steps have handrails.  Any raised decks and porches should have guardrails on the edges.  Have any leaves, snow, or ice cleared regularly.  Use sand or salt on walking paths during winter.  Clean up any spills in your garage right away. This includes oil or grease spills. What can I do in the bathroom?  Use night lights.  Install grab bars by the toilet and in the tub and shower. Do not use towel bars as grab bars.  Use non-skid mats or decals in the tub or shower.  If you need to sit down in the shower, use a plastic, non-slip stool.  Keep the floor dry. Clean up any water that spills on the floor as soon as it happens.  Remove soap buildup in the tub or shower regularly.  Attach bath mats securely with double-sided non-slip rug tape.  Do not have throw rugs and other things on the floor that can make you trip. What can I do in the bedroom?  Use night lights.  Make sure that you have a light by your bed that is easy to reach.  Do not use any sheets or blankets that are too big for your bed. They should not hang down onto the floor.  Have a firm chair that has side arms. You can use this for support while you get dressed.  Do not have throw rugs and other things on the floor that can make you trip. What can I do in the kitchen?  Clean up any spills right away.  Avoid walking on wet floors.  Keep items that you use a lot in easy-to-reach places.  If you need to reach something above you, use a strong step stool that has a grab bar.  Keep electrical cords out of the way.  Do not use floor polish or wax that makes floors slippery. If you must use wax, use non-skid floor wax.  Do not have throw rugs and other things on the floor that can make you trip. What can I do with my  stairs?  Do not leave any items on the stairs.  Make sure that there are handrails on both sides of the stairs and use them. Fix handrails that are broken or loose. Make sure that handrails are as long as the stairways.  Check any carpeting to make sure that it is firmly attached to the stairs. Fix any carpet that is loose or worn.  Avoid having throw rugs at the top or bottom of the stairs. If you do have throw rugs, attach them to the floor with carpet tape.  Make sure that you have a light switch at the top of the stairs and the bottom of the stairs. If you do not have them, ask someone to add them for you. What else can I do to help prevent falls?  Wear shoes that:  Do not have high heels.  Have rubber bottoms.  Are comfortable and fit you well.  Are closed at the toe. Do not wear sandals.  If you use a stepladder:  Make sure that it is fully opened. Do not climb a closed stepladder.  Make sure that both sides of the stepladder are locked into place.  Ask someone to hold it for you, if possible.  Clearly mark and make sure that you can see:  Any grab bars or handrails.  First and last steps.  Where the edge of each step is.  Use tools that help you move around (mobility aids) if they are needed. These include:  Canes.  Walkers.  Scooters.  Crutches.  Turn on the lights when you go into a dark area. Replace any light bulbs as soon as they burn out.  Set up your furniture so you have a clear path. Avoid moving your furniture around.  If any of your floors are uneven, fix them.  If there are any pets around you, be aware of where they are.  Review your medicines with your doctor. Some medicines can make you feel dizzy. This can increase your chance of falling. Ask your doctor what other things that you can do to help prevent falls. This information is not intended to replace advice given to you by your health care provider. Make sure you discuss any  questions you have with your health care provider. Document Released: 02/12/2009 Document Revised: 09/24/2015 Document Reviewed: 05/23/2014 Elsevier Interactive Patient Education  2017 Elsevier Inc[employer]2

## 2019-05-08 NOTE — Progress Notes (Signed)
Subjective:   Kathryn Phillips is a 69 y.o. female who presents for an Initial Medicare Annual Wellness Visit.  Kathryn Phillips is doing very well at this time. 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 She states she is very busy and is only sleeping 4-5 hours nightly due to disruptions related to the group home. She is using melatonin for sleep. Recommended walking or some form of physical exercise and to eat 3 meals each day.  Review of Systems         Objective:    There were no vitals filed for this visit. There is no height or weight on file to calculate BMI.  Advanced Directives 09/12/2013  Does Patient Have a Medical Advance Directive? Patient does not have advance directive  Pre-existing out of facility DNR order (yellow form or pink MOST form) No    Current Medications (verified) Outpatient Encounter Medications as of 05/08/2019  Medication Sig  . acetaminophen (TYLENOL) 100 MG/ML solution Take 10 mg/kg by mouth every 4 (four) hours as needed. Patient used this medication for pain.  . Multiple Vitamin (MULTIVITAMIN) tablet Take 1 tablet by mouth daily.   No facility-administered encounter medications on file as of 05/08/2019.    Allergies (verified) Hydrocodone   History: Past Medical History:  Diagnosis Date  . BACK PAIN, UPPER 02/18/2009  . Cough    Past Surgical History:  Procedure Laterality Date  . CESAREAN SECTION    . myonectomy    . NASAL SINUS SURGERY    . SHOULDER ARTHROSCOPY W/ ROTATOR CUFF REPAIR     Family History  Problem Relation Age of Onset  . Diabetes Father   . Colon cancer Neg Hx   . Pancreatic cancer Neg Hx   . Rectal cancer Neg Hx   . Stomach cancer Neg Hx    Social History   Socioeconomic History  . Marital status: Married    Spouse name: Not on file  . Number of children: Not on file  . Years of education: Not on file  . Highest education level: Not  on file  Occupational History  . Not on file  Tobacco Use  . Smoking status: Never Smoker  . Smokeless tobacco: Never Used  Substance and Sexual Activity  . Alcohol use: Yes  . Drug use: No  . Sexual activity: Not on file  Other Topics Concern  . Not on file  Social History Narrative  . Not on file   Social Determinants of Health   Financial Resource Strain:   . Difficulty of Paying Living Expenses: Not on file  Food Insecurity:   . Worried About Charity fundraiser in the Last Year: Not on file  . Ran Out of Food in the Last Year: Not on file  Transportation Needs:   . Lack of Transportation (Medical): Not on file  . Lack of Transportation (Non-Medical): Not on file  Physical Activity:   . Days of Exercise per Week: Not on file  . Minutes of Exercise per Session: Not on file  Stress:   . Feeling of Stress : Not on file  Social Connections:   . Frequency of Communication with Friends and Family: Not on file  . Frequency of Social Gatherings with Friends and Family: Not on file  . Attends Religious Services: Not on file  . Active Member of Clubs or Organizations: Not on file  .  Attends Archivist Meetings: Not on file  . Marital Status: Not on file    Tobacco Counseling Counseling given: Not Answered   Clinical Intake:                        Activities of Daily Living No flowsheet data found.   Immunizations and Health Maintenance Immunization History  Administered Date(s) Administered  . Fluad Quad(high Dose 65+) 04/23/2019  . Pneumococcal Polysaccharide-23 04/23/2019  . Td 05/02/1994   Health Maintenance Due  Topic Date Due  . MAMMOGRAM  05/02/2017  . DEXA SCAN  10/03/2017    Patient Care Team: Martinique, Betty G, MD as PCP - General (Family Medicine)  Indicate any recent Medical Services you may have received from other than [facility] providers in the past year (date may be approximate).     Assessment:   This is a routine  wellness examination for Kathryn Phillips.  Hearing/Vision screen No exam data present  Dietary issues and exercise activities discussed:    Goals   None    Depression Screen PHQ 2/9 Scores 04/23/2019  PHQ - 2 Score 0    Fall Risk Fall Risk  04/23/2019  Falls in the past year? 0  Number falls in past yr: 0  Injury with Fall? 0  Follow up Education provided    Is the patient's home free of loose throw rugs in walkways, pet beds, electrical cords, etc?   no      Grab bars in the bathroom? yes      Handrails on the stairs?   yes      Adequate lighting?   yes  Timed Get Up and Go Performed Normal  Cognitive Function:        Screening Tests Health Maintenance  Topic Date Due  . MAMMOGRAM  05/02/2017  . DEXA SCAN  10/03/2017  . TETANUS/TDAP  04/22/2020 (Originally 05/02/2004)  . PNA vac Low Risk Adult (2 of 2 - PCV13) 04/22/2020  . COLONOSCOPY  09/26/2023  . INFLUENZA VACCINE  Completed  . Hepatitis C Screening  Completed    Qualifies for Shingles Vaccine? Yes and she will inquire at local pharmacy for her out of pocket costs.   Cancer Screenings: Lung: Low Dose CT Chest recommended if Age 77-80 years, 30 pack-year currently smoking OR have quit w/in 15years. Patient does not qualify. Breast: Up to date on Mammogram? No ; order was sent to [facility] today. Up to date of Bone Density/Dexa? Yes; scheduled for 05/08/2019. Colorectal: yes   Additional Screenings:  Hepatitis C Screening: completed 04/23/2019.    Plan:  Extended time spent with her in discussion about physical exercise and eating 3 meals per day. Recommended she take care of herself in order to continue caring for others. She declines updated Tdap today.   I have personally reviewed and noted the following in the patient's chart:   . Medical and social history . Use of alcohol, tobacco or illicit drugs  . Current medications and supplements . Functional ability and status . Nutritional  status . Physical activity . Advanced directives . List of other physicians . Hospitalizations, surgeries, and ER visits in previous 12 months . Vitals . Screenings to include cognitive, depression, and falls . Referrals and appointments  In addition, I have reviewed and discussed with patient certain preventive protocols, quality metrics, and best practice recommendations. A written personalized care plan for preventive services as well as general preventive health recommendations were  provided to patient.     Franne Forts, LPN   [id number]

## 2019-05-10 DIAGNOSIS — Z780 Asymptomatic menopausal state: Secondary | ICD-10-CM | POA: Diagnosis not present

## 2019-05-14 ENCOUNTER — Encounter: Payer: Self-pay | Admitting: Family Medicine

## 2019-05-14 DIAGNOSIS — Z780 Asymptomatic menopausal state: Secondary | ICD-10-CM

## 2019-05-14 HISTORY — DX: Asymptomatic menopausal state: Z78.0

## 2019-05-26 ENCOUNTER — Ambulatory Visit: Payer: Federal, State, Local not specified - PPO | Attending: Internal Medicine

## 2019-05-26 DIAGNOSIS — Z23 Encounter for immunization: Secondary | ICD-10-CM | POA: Insufficient documentation

## 2019-05-27 NOTE — Progress Notes (Signed)
   Covid-19 Vaccination Clinic  Name:  Kathryn Phillips    MRN: [id number] DOB: 1950-11-14  05/26/2019  [name] was observed post Covid-19 immunization for 15 minutes without incidence. She was provided with Vaccine Information Sheet and instruction to access the V-Safe system.   [name] was instructed to call 911 with any severe reactions post vaccine: Marland Kitchen Difficulty breathing  . Swelling of your face and throat  . A fast heartbeat  . A bad rash all over your body  . Dizziness and weakness    Immunizations Administered    Name Date Dose VIS Date Route   Moderna COVID-19 Vaccine 05/26/2019  3:26 PM 0.5 mL 04/02/2019 Intramuscular   Manufacturer: Levan Hurst   Lot: [id number]   [id number]      Documented on behalf of: P. Lamount Cohen

## 2019-06-09 ENCOUNTER — Other Ambulatory Visit: Payer: Self-pay

## 2019-06-09 ENCOUNTER — Emergency Department (HOSPITAL_BASED_OUTPATIENT_CLINIC_OR_DEPARTMENT_OTHER): Payer: Medicare Other

## 2019-06-09 ENCOUNTER — Emergency Department (HOSPITAL_BASED_OUTPATIENT_CLINIC_OR_DEPARTMENT_OTHER)
Admission: EM | Admit: 2019-06-09 | Discharge: 2019-06-09 | Disposition: A | Discharge status: Home / Self Care | Payer: Medicare Other | Attending: Emergency Medicine | Admitting: Emergency Medicine

## 2019-06-09 ENCOUNTER — Encounter (HOSPITAL_BASED_OUTPATIENT_CLINIC_OR_DEPARTMENT_OTHER): Payer: Self-pay | Admitting: *Deleted

## 2019-06-09 DIAGNOSIS — Z20822 Contact with and (suspected) exposure to covid-19: Secondary | ICD-10-CM | POA: Diagnosis not present

## 2019-06-09 DIAGNOSIS — R05 Cough: Secondary | ICD-10-CM | POA: Diagnosis not present

## 2019-06-09 DIAGNOSIS — Z79899 Other long term (current) drug therapy: Secondary | ICD-10-CM | POA: Diagnosis not present

## 2019-06-09 DIAGNOSIS — B9789 Other viral agents as the cause of diseases classified elsewhere: Secondary | ICD-10-CM | POA: Diagnosis not present

## 2019-06-09 DIAGNOSIS — R0981 Nasal congestion: Secondary | ICD-10-CM | POA: Diagnosis present

## 2019-06-09 DIAGNOSIS — J069 Acute upper respiratory infection, unspecified: Secondary | ICD-10-CM | POA: Insufficient documentation

## 2019-06-09 LAB — SARS CORONAVIRUS 2 AG (30 MIN TAT): SARS Coronavirus 2 Ag: NEGATIVE

## 2019-06-09 MED ORDER — PROMETHAZINE-DM 6.25-15 MG/5ML PO SYRP: 5.0000 mL | ORAL_SOLUTION | Freq: Every evening | ORAL | 0 refills | Status: DC | PRN

## 2019-06-09 MED ORDER — ALBUTEROL SULFATE HFA 108 (90 BASE) MCG/ACT IN AERS
2.0000 | INHALATION_SPRAY | Freq: Once | RESPIRATORY_TRACT | Status: AC
  Administered 2019-06-09: 20:00:00 2 via RESPIRATORY_TRACT
  Filled 2019-06-09: qty 6.7

## 2019-06-09 MED ORDER — PREDNISONE 20 MG PO TABS: ORAL_TABLET | ORAL | 0 refills | Status: DC

## 2019-06-09 MED ORDER — PREDNISONE 50 MG PO TABS
60.0000 mg | ORAL_TABLET | Freq: Once | ORAL | Status: AC
  Administered 2019-06-09: 21:00:00 60 mg via ORAL
  Filled 2019-06-09: qty 1

## 2019-06-09 NOTE — ED Triage Notes (Signed)
States she has had congestion x 10 days. Concerned for sinus infection. States she has a cough that is worse at night. She had her 1st covid vaccine on Jan 24. Reports using mucinex without relief

## 2019-06-09 NOTE — ED Notes (Signed)
Pt reports cough and sinus congestion x 10 days. Pt denies fever. Pt also denies sob.

## 2019-06-09 NOTE — ED Provider Notes (Signed)
[facility] Provider Note   CSN: [id number] Arrival date & time: 06/09/19  1844     History Chief Complaint  Patient presents with  . Nasal Congestion    Kathryn Phillips is a 69 y.o. female.  Patient is a 69 year old female with no significant past medical history who presents with nasal congestion and coughing.  She states she has had symptoms for about the last 10 days.  She has had nasal congestion with postnasal drip and now she feels it down into her chest with chest congestion.  She has a lot of coughing particularly at night.  She says whenever she tries to lay down and sleep, she coughs incessantly.  She denies any significant shortness of breath.  No associated pain.  No vomiting or diarrhea.  No known fevers.  She has been using Mucinex and allergy medicine without improvement in symptoms.  She has no history of asthma or other lung problems.        Past Medical History:  Diagnosis Date  . BACK PAIN, UPPER 02/18/2009  . Cough     Patient Active Problem List   Diagnosis Date Noted  . Osteopenia after menopause 05/14/2019  . Class 1 obesity with body mass index (BMI) of 31.0 to 31.9 in adult 04/23/2019  . Insomnia 04/23/2019  . 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 Allergic rhinitis 07/19/2010  . BACK PAIN, UPPER 02/18/2009  . ARM PAIN, LEFT 11/07/2007    Past Surgical History:  Procedure Laterality Date  . CESAREAN SECTION    . myonectomy    . NASAL SINUS SURGERY    . SHOULDER ARTHROSCOPY W/ ROTATOR CUFF REPAIR       OB History   No obstetric history on file.     Family History  Problem Relation Age of Onset  . Diabetes Father   . Colon cancer Neg Hx   . Pancreatic cancer Neg Hx   . Rectal cancer Neg Hx   . Stomach cancer Neg Hx     Social History   Tobacco Use  . Smoking status: Never Smoker  . Smokeless tobacco: Never Used  Substance Use Topics  . Alcohol use: Not Currently  . Drug use: No    Home  Medications Prior to Admission medications   Medication Sig Start Date End Date Taking? Authorizing Provider  Multiple Vitamin (MULTIVITAMIN) tablet Take 1 tablet by mouth daily.   Yes [provider]  acetaminophen (TYLENOL) 100 MG/ML solution Take 10 mg/kg by mouth every 4 (four) hours as needed. Patient used this medication for pain.    [provider]  predniSONE (DELTASONE) 20 MG tablet 2 tabs po daily x 4 days 06/09/19   Malvin Johns, MD  promethazine-dextromethorphan (PROMETHAZINE-DM) 6.25-15 MG/5ML syrup Take 5 mLs by mouth at bedtime as needed for cough. 06/09/19   Malvin Johns, MD    Allergies    Hydrocodone  Review of Systems   Review of Systems  Constitutional: Positive for fatigue. Negative for chills, diaphoresis and fever.  HENT: Positive for congestion, postnasal drip and rhinorrhea. Negative for sneezing.   Eyes: Negative.   Respiratory: Positive for cough and shortness of breath. Negative for chest tightness.   Cardiovascular: Negative for chest pain and leg swelling.  Gastrointestinal: Negative for abdominal pain, blood in stool, diarrhea, nausea and vomiting.  Genitourinary: Negative for difficulty urinating, flank pain, frequency and hematuria.  Musculoskeletal: Negative for arthralgias and back pain.  Skin: Negative for rash.  Neurological: Negative for  dizziness, speech difficulty, weakness, numbness and headaches.    Physical Exam Updated Vital Signs BP (!) 148/85 (BP Location: Right Arm)   Pulse 91   Temp 98.1 F (36.7 C) (Oral)   Resp 20   Ht 5\' 5"  (1.651 m)   Wt 83.9 kg   SpO2 98%   BMI 30.79 kg/m   Physical Exam Constitutional:      Appearance: She is well-developed.  HENT:     Head: Normocephalic and atraumatic.     Right Ear: Tympanic membrane normal.     Left Ear: Tympanic membrane normal.     Mouth/Throat:     Mouth: Mucous membranes are moist.     Pharynx: No oropharyngeal exudate or posterior oropharyngeal erythema.   Eyes:     Pupils: Pupils are equal, round, and reactive to light.  Cardiovascular:     Rate and Rhythm: Normal rate and regular rhythm.     Heart sounds: Normal heart sounds.  Pulmonary:     Effort: Pulmonary effort is normal. No respiratory distress.     Breath sounds: Wheezing present. No rales.  Chest:     Chest wall: No tenderness.  Abdominal:     General: Bowel sounds are normal.     Palpations: Abdomen is soft.     Tenderness: There is no abdominal tenderness. There is no guarding or rebound.  Musculoskeletal:        General: Normal range of motion.     Cervical back: Normal range of motion and neck supple.  Lymphadenopathy:     Cervical: No cervical adenopathy.  Skin:    General: Skin is warm and dry.     Findings: No rash.  Neurological:     Mental Status: She is alert and oriented to person, place, and time.     ED Results / Procedures / Treatments   Labs (all labs ordered are listed, but only abnormal results are displayed) Labs Reviewed  SARS CORONAVIRUS 2 AG (30 MIN TAT)  SARS CORONAVIRUS 2 (TAT 6-24 HRS)    EKG None  Radiology DG Chest Port 1 View  Result Date: 06/09/2019 CLINICAL DATA:  Cough and congestion. EXAM: PORTABLE CHEST 1 VIEW COMPARISON:  March 06, 2017 FINDINGS: There is no evidence of acute infiltrate, pleural effusion or pneumothorax. The heart size and mediastinal contours are within normal limits. A radiopaque fusion plate and screws are seen overlying the lower cervical spine. The visualized skeletal structures are unremarkable. IMPRESSION: No active disease. Electronically Signed   By: Virgina Norfolk M.D.   On: 06/09/2019 19:32    Procedures Procedures (including critical care time)  Medications Ordered in ED Medications  predniSONE (DELTASONE) tablet 60 mg (has no administration in time range)  albuterol (VENTOLIN HFA) 108 (90 Base) MCG/ACT inhaler 2 puff (2 puffs Inhalation Given 06/09/19 1940)    ED Course  I have reviewed  the triage vital signs and the nursing notes.  Pertinent labs & imaging results that were available during my care of the patient were reviewed by me and considered in my medical decision making (see chart for details).    MDM Rules/Calculators/A&P                     Patient is a 69 year old female who presents with URI symptoms and cough.  She had some wheezing on exam but no accessory muscle use or increased work of breathing.  She was given albuterol MDI and advised she can use this at home  every 4-6 hours as needed.  She was also started on a course of prednisone.  Her main concern is her coughing at night and keeping her awake.  She is tried multiple over-the-counter medicines without improvement.  She has an allergy to hydrocodone so was given a prescription for Phenergan DM to use only at night for coughing.  Her chest x-ray shows no evidence of pneumonia.  This is likely viral in nature.  Her rapid Covid was negative.  Her PCR is pending.  She was advised to follow-up with her PCP if her symptoms are not improving or return here as needed for any worsening symptoms. Final Clinical Impression(s) / ED Diagnoses Final diagnoses:  Viral URI with cough    Rx / DC Orders ED Discharge Orders         Ordered    predniSONE (DELTASONE) 20 MG tablet     06/09/19 2023    promethazine-dextromethorphan (PROMETHAZINE-DM) 6.25-15 MG/5ML syrup  At bedtime PRN     06/09/19 2023           Malvin Johns, MD 06/09/19 2025

## 2019-06-10 LAB — SARS CORONAVIRUS 2 (TAT 6-24 HRS): SARS Coronavirus 2: NEGATIVE

## 2019-06-23 ENCOUNTER — Ambulatory Visit: Payer: Medicare Other | Attending: Internal Medicine

## 2019-06-23 DIAGNOSIS — Z23 Encounter for immunization: Secondary | ICD-10-CM | POA: Insufficient documentation

## 2019-06-23 NOTE — Progress Notes (Signed)
   Covid-19 Vaccination Clinic  Name:  Kathryn Phillips    MRN: [id number] DOB: 02/12/1951  06/23/2019  [name] was observed post Covid-19 immunization for 15 minutes without incidence. She was provided with Vaccine Information Sheet and instruction to access the V-Safe system.   [name] was instructed to call 911 with any severe reactions post vaccine: Marland Kitchen Difficulty breathing  . Swelling of your face and throat  . A fast heartbeat  . A bad rash all over your body  . Dizziness and weakness    Immunizations Administered    Name Date Dose VIS Date Route   Moderna COVID-19 Vaccine 06/23/2019 12:08 PM 0.5 mL 04/02/2019 Intramuscular   Manufacturer: Moderna   Lot: [id number]   [id number]

## 2019-06-26 ENCOUNTER — Ambulatory Visit: Payer: Federal, State, Local not specified - PPO

## 2019-08-05 ENCOUNTER — Ambulatory Visit: Payer: Federal, State, Local not specified - PPO

## 2019-11-15 ENCOUNTER — Other Ambulatory Visit: Payer: Self-pay | Admitting: Family Medicine

## 2019-11-15 DIAGNOSIS — Z1231 Encounter for screening mammogram for malignant neoplasm of breast: Secondary | ICD-10-CM

## 2019-11-20 ENCOUNTER — Ambulatory Visit: Payer: Federal, State, Local not specified - PPO

## 2019-11-21 ENCOUNTER — Ambulatory Visit
Admission: RE | Admit: 2019-11-21 | Discharge: 2019-11-21 | Disposition: A | Discharge status: Home / Self Care | Payer: Federal, State, Local not specified - PPO | Source: Ambulatory Visit | Attending: Family Medicine | Admitting: Family Medicine

## 2019-11-21 ENCOUNTER — Other Ambulatory Visit: Payer: Self-pay

## 2019-11-21 DIAGNOSIS — Z1231 Encounter for screening mammogram for malignant neoplasm of breast: Secondary | ICD-10-CM

## 2019-12-24 ENCOUNTER — Encounter: Payer: Self-pay | Admitting: Family Medicine

## 2019-12-24 ENCOUNTER — Ambulatory Visit (INDEPENDENT_AMBULATORY_CARE_PROVIDER_SITE_OTHER): Payer: Medicare Other | Admitting: Family Medicine

## 2019-12-24 ENCOUNTER — Other Ambulatory Visit: Payer: Self-pay

## 2019-12-24 VITALS — BP 128/74 | HR 95 | Temp 97.9°F | Resp 16 | Ht 65.0 in | Wt 197.0 lb

## 2019-12-24 DIAGNOSIS — R519 Headache, unspecified: Secondary | ICD-10-CM | POA: Diagnosis not present

## 2019-12-24 DIAGNOSIS — E669 Obesity, unspecified: Secondary | ICD-10-CM | POA: Diagnosis not present

## 2019-12-24 DIAGNOSIS — R7303 Prediabetes: Secondary | ICD-10-CM

## 2019-12-24 DIAGNOSIS — J309 Allergic rhinitis, unspecified: Secondary | ICD-10-CM | POA: Diagnosis not present

## 2019-12-24 DIAGNOSIS — Z6831 Body mass index (BMI) 31.0-31.9, adult: Secondary | ICD-10-CM

## 2019-12-24 LAB — POCT GLYCOSYLATED HEMOGLOBIN (HGB A1C): HbA1c, POC (prediabetic range): 5.9 % (ref 5.7–6.4)

## 2019-12-24 MED ORDER — FLUTICASONE PROPIONATE 50 MCG/ACT NA SUSP: 2.0000 | Freq: Every day | NASAL | 1 refills | Status: DC

## 2019-12-24 NOTE — Patient Instructions (Addendum)
A few things to remember from today's visit:  No diabetes yet. Wt loss through a healthful diet and regular physical activity is recommended for primary prevention of diabetes. Appointment with neurologist will be arranged. Please arrange appointment with eye care provider. I am sending a nasal spray for you to use for a few days and monitor if headache changes.  I prefer Tylenol instead Ibuprofen for headache.  If you need refills please call your pharmacy. Do not use My Chart to request refills or for acute issues that need immediate attention.    Please be sure medication list is accurate. If a new problem present, please set up appointment sooner than planned today.

## 2019-12-24 NOTE — Progress Notes (Signed)
Chief Complaint  Patient presents with  . Headache    x 1-2 months   HPI: Kathryn Phillips is a 69 y.o. female, who is here today with above concern. She was last seen on 04/20/20. We have discussed headaches in the past, 02/07/2018 but she states that this headache is different.  At least 2 months of frontal headache, sometimes associated with dizziness (3 times since onset). Lightheadedness, she is not sure if this because she is getting up fast, she has to hold walls,so she does not fall.  Headache is pounding, 8/10, takes Ibuprofen and lies down.  It is mainly in the morning when she wakes up About 3 times per week but adds that frequency has improved, lately once per week. She has not identified exacerbating or alleviating factors.  + Fatigue, no known sleep apnea. Sleeping about 5-6 hours.  No associated photophobia,visual changes, Phillips/V,or weakness. Negative for fever, night sweats, and abnormal weight loss.  She had eye exam years ago. She has been working in front of the computer for a couple hours per day but she used to do more when she was working.  Hx of allergic rhinitis. She has some nasal congestion. She not taking OTC antihistaminic.  She states that her husband has been concerned about headache and wants to have neuro evaluation, she agrees.  Prediabetes: She has not been consistent with following a healthful diet, she has gained some weight since her last visit. Negative for polydipsia,polyuria, or polyphagia.  Lab Results  Component Value Date   HGBA1C 6.4 04/23/2019   Review of Systems  Constitutional: Negative for activity change and appetite change.  HENT: Negative for mouth sores, nosebleeds and trouble swallowing.   Eyes: Positive for discharge (Epiphora, bilateral.). Negative for redness and itching.  Respiratory: Negative for cough, shortness of breath and wheezing.   Cardiovascular: Negative for chest pain, palpitations and leg  swelling.  Gastrointestinal: Negative for abdominal pain.       Negative for changes in bowel habits.  Skin: Negative for pallor and rash.  Allergic/Immunologic: Positive for environmental allergies.  Neurological: Negative for syncope, facial asymmetry and weakness.  Rest see pertinent positives and negatives per HPI.  Current Outpatient Medications on File Prior to Visit  Medication Sig Dispense Refill  . acetaminophen (TYLENOL) 100 MG/ML solution Take 10 mg/kg by mouth every 4 (four) hours as needed. Patient used this medication for pain.    . Multiple Vitamin (MULTIVITAMIN) tablet Take 1 tablet by mouth daily.     No current facility-administered medications on file prior to visit.   Past Medical History:  Diagnosis Date  . BACK PAIN, UPPER 02/18/2009  . Cough    Allergies  Allergen Reactions  . Hydrocodone Nausea And Vomiting   Social History   Socioeconomic History  . Marital status: Married    Spouse name: Not on file  . Number of children: 1  . Years of education: 8  . Highest education level: Associate degree: occupational, Hotel manager, or vocational program  Occupational History  . 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 Smoking status: Never Smoker  . Smokeless tobacco: Never Used  Vaping Use  . Vaping Use: Never used  Substance and Sexual Activity  . Alcohol use: Not Currently  . Drug use: No  . Sexual activity: Not on file  Other Topics Concern  . 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 Difficulty of Paying Living Expenses: Not very hard  Food Insecurity: No Food Insecurity  . Worried About Charity fundraiser in the Last Year: Never true  . Ran Out of Food in the Last Year: Never true  Transportation Needs: No Transportation Needs  . Lack of  Transportation (Medical): No  . Lack of Transportation (Non-Medical): No  Physical Activity: Inactive  . Days of Exercise per Week: 0 days  . Minutes of Exercise per Session: 0 min  Stress: No Stress Concern Present  . Feeling of Stress : Not at all  Social Connections: Socially Integrated  . Frequency of Communication with Friends and Family: More than three times a week  . Frequency of Social Gatherings with Friends and Family: Twice a week  . Attends Religious Services: More than 4 times per year  . Active Member of Clubs or Organizations: Yes  . Attends Archivist Meetings: More than 4 times per year  . Marital Status: Married   Vitals:   12/24/19 1055  BP: 128/74  Pulse: 95  Resp: 16  Temp: 97.9 F (36.6 C)  SpO2: 98%   Wt Readings from Last 3 Encounters:  12/24/19 197 lb (89.4 kg)  06/09/19 185 lb (83.9 kg)  05/08/19 194 lb 3.2 oz (88.1 kg)   Body mass index is 32.78 kg/m.  Physical Exam Vitals and nursing note reviewed.  Constitutional:      General: She is not in acute distress.    Appearance: She is well-developed.  HENT:     Head: Normocephalic and atraumatic.  Eyes:     Conjunctiva/sclera: Conjunctivae normal.     Pupils: Pupils are equal, round, and reactive to light.  Cardiovascular:     Rate and Rhythm: Normal rate and regular rhythm.     Heart sounds: No murmur heard.   Pulmonary:     Effort: Pulmonary effort is normal. No respiratory distress.     Breath sounds: Normal breath sounds.  Lymphadenopathy:     Cervical: No cervical adenopathy.  Skin:    General: Skin is warm.     Findings: No erythema or rash.  Neurological:     Mental Status: She is alert and oriented to person, place, and time.     Cranial Nerves: No cranial nerve deficit.     Gait: Gait normal.     Deep Tendon Reflexes:     Reflex Scores:      Bicep reflexes are 2+ on the right side and 2+ on the left side.      Achilles reflexes are 2+ on the right side and 2+ on  the left side. Psychiatric:     Comments: Well groomed, good eye contact.   ASSESSMENT AND PLAN:  Ms.Kathryn Phillips was seen today for headache.  Diagnoses and all orders for this visit:  Lab Results  Component Value Date   HGBA1C 5.9 12/24/2019   Frontal headache We discussed possible etiologies. History and examination today do not suggest a serious process. ?  Allergies, tension headache, sleep apnea and refractory side effects among some. We discussed options at this time, she would like to see neurologist. She can take Tylenol 500 mg 1-2 times daily as needed. She was clearly instructed about warning signs.  Prediabetes Healthy lifestyle and weight loss recommended for primary prevention of diabetes.  Allergic rhinitis, unspecified seasonality, unspecified trigger This could be a contributing factor. Recommend intranasal steroid for 2 to 3 weeks, you may help with headache.  -     fluticasone (FLONASE) 50 MCG/ACT nasal spray; Place 2 sprays into both nostrils daily.  Class 1 obesity without serious comorbidity with body mass index (BMI) of 31.0 to 31.9 in adult, unspecified obesity type We discussed benefits of weight loss. Consistency with following a healthful diet and engaging in regular physical activity will help.  Return in about 4 months (around 04/24/2020) for cpe.   Maralee Higuchi G. Martinique, MD  [facility]. [place] office.  A few things to remember from today's visit:  No diabetes yet. Wt loss through a healthful diet and regular physical activity is recommended for primary prevention of diabetes. Appointment with neurologist will be arranged. Please arrange appointment with eye care provider. I am sending a nasal spray for you to use for a few days and monitor if headache changes.  I prefer Tylenol instead Ibuprofen for headache.  If you need refills please call your pharmacy. Do not use My Chart to request refills or for acute issues that need immediate  attention.    Please be sure medication list is accurate. If a new problem present, please set up appointment sooner than planned today.

## 2020-01-08 ENCOUNTER — Other Ambulatory Visit: Payer: Self-pay

## 2020-01-08 DIAGNOSIS — J309 Allergic rhinitis, unspecified: Secondary | ICD-10-CM

## 2020-01-08 MED ORDER — FLUTICASONE PROPIONATE 50 MCG/ACT NA SUSP: 2.0000 | Freq: Every day | NASAL | 1 refills | Status: DC

## 2020-02-25 ENCOUNTER — Other Ambulatory Visit: Payer: Self-pay

## 2020-02-25 ENCOUNTER — Encounter (HOSPITAL_BASED_OUTPATIENT_CLINIC_OR_DEPARTMENT_OTHER): Payer: Self-pay

## 2020-02-25 ENCOUNTER — Emergency Department (HOSPITAL_BASED_OUTPATIENT_CLINIC_OR_DEPARTMENT_OTHER)
Admission: EM | Admit: 2020-02-25 | Discharge: 2020-02-25 | Disposition: A | Discharge status: Home / Self Care | Payer: Medicare Other | Attending: Emergency Medicine | Admitting: Emergency Medicine

## 2020-02-25 ENCOUNTER — Emergency Department (HOSPITAL_BASED_OUTPATIENT_CLINIC_OR_DEPARTMENT_OTHER): Payer: Medicare Other

## 2020-02-25 DIAGNOSIS — M19049 Primary osteoarthritis, unspecified hand: Secondary | ICD-10-CM

## 2020-02-25 DIAGNOSIS — M79641 Pain in right hand: Secondary | ICD-10-CM | POA: Insufficient documentation

## 2020-02-25 DIAGNOSIS — M19041 Primary osteoarthritis, right hand: Secondary | ICD-10-CM | POA: Diagnosis not present

## 2020-02-25 MED ORDER — IBUPROFEN 400 MG PO TABS
400.0000 mg | ORAL_TABLET | Freq: Once | ORAL | Status: AC
  Administered 2020-02-25: 400 mg via ORAL
  Filled 2020-02-25: qty 1

## 2020-02-25 NOTE — ED Provider Notes (Signed)
[facility] Provider Note   CSN: [id number] Arrival date & time: 02/25/20  2130     History Chief Complaint  Patient presents with  . Hand Pain    Kathryn Phillips is a 69 y.o. female here presenting with right hand pain.  Patient states that she has been having right hand pain for the last week or so.  Denies picking up any heavy items or trauma or injury.  She has been taking Tylenol with no relief.  Denies any fevers or chills.  Denies any history of arthritis.  The history is provided by the patient.       Past Medical History:  Diagnosis Date  . BACK PAIN, UPPER 02/18/2009  . Cough     Patient Active Problem List   Diagnosis Date Noted  . Osteopenia after menopause 05/14/2019  . Class 1 obesity with body mass index (BMI) of 31.0 to 31.9 in adult 04/23/2019  . Insomnia 04/23/2019  . 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 Allergic rhinitis 07/19/2010  . BACK PAIN, UPPER 02/18/2009  . ARM PAIN, LEFT 11/07/2007    Past Surgical History:  Procedure Laterality Date  . CESAREAN SECTION    . myonectomy    . NASAL SINUS SURGERY    . SHOULDER ARTHROSCOPY W/ ROTATOR CUFF REPAIR       OB History   No obstetric history on file.     Family History  Problem Relation Age of Onset  . Diabetes Father   . Colon cancer Neg Hx   . Pancreatic cancer Neg Hx   . Rectal cancer Neg Hx   . Stomach cancer Neg Hx     Social History   Tobacco Use  . Smoking status: Never Smoker  . Smokeless tobacco: Never Used  Vaping Use  . Vaping Use: Never used  Substance Use Topics  . Alcohol use: Not Currently  . Drug use: No    Home Medications Prior to Admission medications   Medication Sig Start Date End Date Taking? Authorizing Provider  acetaminophen (TYLENOL) 100 MG/ML solution Take 10 mg/kg by mouth every 4 (four) hours as needed. Patient used this medication for pain.    [provider]  fluticasone (FLONASE) 50 MCG/ACT nasal spray Place  2 sprays into both nostrils daily. 01/08/20   Martinique, Betty G, MD  Multiple Vitamin (MULTIVITAMIN) tablet Take 1 tablet by mouth daily.    [provider]    Allergies    Hydrocodone  Review of Systems   Review of Systems  Musculoskeletal:       R hand pain   All other systems reviewed and are negative.   Physical Exam Updated Vital Signs BP 140/75 (BP Location: Left Arm)   Pulse 83   Temp 98.4 F (36.9 C) (Oral)   Resp 20   Ht 5\' 5"  (1.651 m)   Wt 88.9 kg   SpO2 97%   BMI 32.62 kg/m   Physical Exam Vitals and nursing note reviewed.  HENT:     Head: Normocephalic.     Nose: Nose normal.     Mouth/Throat:     Mouth: Mucous membranes are moist.  Eyes:     Extraocular Movements: Extraocular movements intact.     Pupils: Pupils are equal, round, and reactive to light.  Cardiovascular:     Rate and Rhythm: Normal rate.     Pulses: Normal pulses.  Pulmonary:     Effort: Pulmonary effort is normal.  Breath sounds: Normal breath sounds.  Abdominal:     General: Abdomen is flat.     Palpations: Abdomen is soft.  Musculoskeletal:     Cervical back: Normal range of motion.     Comments: Mild tenderness in the base of the right thumb.  No obvious deformity.  Patient is able to range the wrist as well.  Normal capillary refill on all fingers.  Normal hand grasp.  Normal thumb adduction and abduction.  Normal radial pulse  Skin:    General: Skin is warm.     Capillary Refill: Capillary refill takes less than 2 seconds.  Neurological:     General: No focal deficit present.     Mental Status: She is alert.  Psychiatric:        Mood and Affect: Mood normal.        Behavior: Behavior normal.     ED Results / Procedures / Treatments   Labs (all labs ordered are listed, but only abnormal results are displayed) Labs Reviewed - No data to display  EKG None  Radiology No results found.  Procedures Procedures (including critical care time)  Medications  Ordered in ED Medications  ibuprofen (ADVIL) tablet 400 mg (has no administration in time range)    ED Course  I have reviewed the triage vital signs and the nursing notes.  Pertinent labs & imaging results that were available during my care of the patient were reviewed by me and considered in my medical decision making (see chart for details).    MDM Rules/Calculators/A&P                         Jereline Ticer Gupton is a 69 y.o. female here presenting with right thumb pain.  Likely right thumb sprain versus arthritis.  Will get x-rays and give Motrin for pain.  10:04 PM Xray showed mild arthritis. Will dc home with motrin. Will have her follow up with ortho outpatient    Final Clinical Impression(s) / ED Diagnoses Final diagnoses:  None    Rx / DC Orders ED Discharge Orders    None       Drenda Freeze, MD 02/25/20 2204

## 2020-02-25 NOTE — ED Triage Notes (Signed)
Pt c/o pain to right hand x 2 weeks-denies injury-NAD-steady gait

## 2020-02-25 NOTE — Discharge Instructions (Signed)
Take Motrin 600 mg every 6 hours for pain.  If you still have pain in a week, consider following up with either Dr. Raeford Razor or Dr. Lorin Mercy office for possible further evaluation and steroid injection  Return to ER if you have worse hand pain or swelling, trouble moving your fingers.

## 2020-03-05 ENCOUNTER — Telehealth: Payer: Self-pay | Admitting: Family Medicine

## 2020-03-05 NOTE — Telephone Encounter (Signed)
Called Pt to offer ED follow up appt w/ Dr.Schmitz--no answer, message left.--glh

## 2020-03-10 ENCOUNTER — Ambulatory Visit: Payer: Federal, State, Local not specified - PPO | Admitting: Family Medicine

## 2020-03-11 ENCOUNTER — Other Ambulatory Visit: Payer: Self-pay

## 2020-03-11 ENCOUNTER — Encounter: Payer: Self-pay | Admitting: Family Medicine

## 2020-03-11 ENCOUNTER — Ambulatory Visit: Payer: Self-pay

## 2020-03-11 ENCOUNTER — Ambulatory Visit (INDEPENDENT_AMBULATORY_CARE_PROVIDER_SITE_OTHER): Payer: Medicare Other | Admitting: Family Medicine

## 2020-03-11 VITALS — BP 147/89 | HR 76 | Ht 66.0 in | Wt 190.0 lb

## 2020-03-11 DIAGNOSIS — M65311 Trigger thumb, right thumb: Secondary | ICD-10-CM

## 2020-03-11 HISTORY — DX: Trigger thumb, right thumb: M65.311

## 2020-03-11 MED ORDER — TRIAMCINOLONE ACETONIDE 40 MG/ML IJ SUSP
40.0000 mg | Freq: Once | INTRAMUSCULAR | Status: AC
  Administered 2020-03-11: 40 mg via INTRA_ARTICULAR

## 2020-03-11 NOTE — Assessment & Plan Note (Signed)
Has triggering observed on ultrasound and clinical exam. -Counseled on home exercise therapy and supportive care. -Injection. -Splint. -Could consider reinjection or further splinting.

## 2020-03-11 NOTE — Progress Notes (Signed)
Kathryn Phillips [facility] - 69 y.o. female MRN [id number]  Date of birth: 19-Dec-1950  SUBJECTIVE:  Including CC & ROS.  Chief Complaint  Patient presents with  . Hand Pain    right thumb x 1 month    Kathryn Phillips is a 69 y.o. female that is presenting with right thumb pain.  Has been ongoing for a few weeks.  No injury or inciting event.  Seems to be constant throughout the day.  She does notice triggering..  Independent review of the right hand x-ray from 10/26 shows no acute abnormalities.   Review of Systems See HPI   HISTORY: Past Medical, Surgical, Social, and Family History Reviewed & Updated per EMR.   Pertinent Historical Findings include:  Past Medical History:  Diagnosis Date  . BACK PAIN, UPPER 02/18/2009  . Cough     Past Surgical History:  Procedure Laterality Date  . CESAREAN SECTION    . myonectomy    . NASAL SINUS SURGERY    . SHOULDER ARTHROSCOPY W/ ROTATOR CUFF REPAIR      Family History  Problem Relation Age of Onset  . Diabetes Father   . Colon cancer Neg Hx   . Pancreatic cancer Neg Hx   . Rectal cancer Neg Hx   . Stomach cancer Neg Hx     Social History   Socioeconomic History  . Marital status: Married    Spouse name: Not on file  . Number of children: 1  . Years of education: 21  . Highest education level: Associate degree: occupational, Hotel manager, or vocational program  Occupational History  . 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 Smoking status: Never Smoker  . Smokeless tobacco: Never Used  Vaping Use  . Vaping Use: Never used  Substance and Sexual Activity  . Alcohol use: Not Currently  . Drug use: No  . Sexual activity: Not on file  Other Topics Concern  . 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 Difficulty of Paying  Living Expenses: Not very hard  Food Insecurity: No Food Insecurity  . Worried About Charity fundraiser in the Last Year: Never true  . Ran Out of Food in the Last Year: Never true  Transportation Needs: No Transportation Needs  . Lack of Transportation (Medical): No  . Lack of Transportation (Non-Medical): No  Physical Activity: Inactive  . Days of Exercise per Week: 0 days  . Minutes of Exercise per Session: 0 min  Stress: No Stress Concern Present  . Feeling of Stress : Not at all  Social Connections: Socially Integrated  . Frequency of Communication with Friends and Family: More than three times a week  . Frequency of Social Gatherings with Friends and Family: Twice a week  . Attends Religious Services: More than 4 times per year  . Active Member of Clubs or Organizations: Yes  . Attends Archivist Meetings: More than 4 times per year  . Marital Status: Married  Human resources officer Violence:   . Fear of Current or Ex-Partner: Not on file  . Emotionally Abused: Not on file  . Physically Abused: Not on file  . Sexually Abused: Not on file     PHYSICAL EXAM:  VS: BP (!) 147/89  Pulse 76   Ht 5\' 6"  (1.676 m)   Wt 190 lb (86.2 kg)   BMI 30.67 kg/m  Physical Exam Gen: NAD, alert, cooperative with exam, well-appearing MSK:  Right thumb: No swelling of the digit. Triggering appreciated on exam. No instability. Neurovascular intact  Limited ultrasound: Right thumb:  Nodule appreciated over the A1 pulley. Thickening of the flexor thumb tendon as well as hyperemia. Triggering appreciated on dynamic testing.  Summary: Trigger thumb  Ultrasound and interpretation by Clearance Coots, MD   Aspiration/Injection Procedure Note Kathryn Phillips [facility] June 03, 1950  Procedure: Injection Indications: Right trigger thumb  Procedure Details Consent: Risks of procedure as well as the alternatives and risks of each were explained to the (patient/caregiver).  Consent for  procedure obtained. Time Out: Verified patient identification, verified procedure, site/side was marked, verified correct patient position, special equipment/implants available, medications/allergies/relevent history reviewed, required imaging and test results available.  Performed.  The area was cleaned with iodine and alcohol swabs.    The Right trigger thumb  was injected using 0.5 cc's of 40 mg Kenalog and 0.5 cc's of 0.25% bupivacaine with a 25 1 1/2" needle.  Ultrasound was used. Images were obtained in long views showing the injection.     A sterile dressing was applied.  Patient did tolerate procedure well.    ASSESSMENT & PLAN:   Trigger finger of right thumb Has triggering observed on ultrasound and clinical exam. -Counseled on home exercise therapy and supportive care. -Injection. -Splint. -Could consider reinjection or further splinting.

## 2020-03-11 NOTE — Patient Instructions (Signed)
Nice to meet you Please try the splint at least at night. Try this for at least 3 weeks  Please try voltaren  Please try ice as needed   Please send me a message in MyChart with any questions or updates.  Please see me back in 3-4 weeks.   --Dr. Raeford Razor

## 2020-04-01 ENCOUNTER — Encounter: Payer: Self-pay | Admitting: Family Medicine

## 2020-04-01 ENCOUNTER — Other Ambulatory Visit: Payer: Self-pay

## 2020-04-01 ENCOUNTER — Ambulatory Visit (INDEPENDENT_AMBULATORY_CARE_PROVIDER_SITE_OTHER): Payer: Medicare Other | Admitting: Family Medicine

## 2020-04-01 DIAGNOSIS — M65311 Trigger thumb, right thumb: Secondary | ICD-10-CM | POA: Diagnosis not present

## 2020-04-01 NOTE — Assessment & Plan Note (Signed)
Has been normal with splinting and injection. -Counseled on home exercise therapy and supportive care. -Counseled on splinting. -Could repeat injection if needed.

## 2020-04-01 NOTE — Progress Notes (Signed)
Kathryn Phillips [facility] - 69 y.o. female MRN [id number]  Date of birth: 1950-10-15  SUBJECTIVE:  Including CC & ROS.  Chief Complaint  Patient presents with  . Follow-up    right thumb    Kathryn Phillips is a 69 y.o. female that is following up of her trigger thumb.  She has done well with the injections and the splint.  She denies any pain.  Only has mild intermittent symptoms..   Review of Systems See HPI   HISTORY: Past Medical, Surgical, Social, and Family History Reviewed & Updated per EMR.   Pertinent Historical Findings include:  Past Medical History:  Diagnosis Date  . BACK PAIN, UPPER 02/18/2009  . Cough     Past Surgical History:  Procedure Laterality Date  . CESAREAN SECTION    . myonectomy    . NASAL SINUS SURGERY    . SHOULDER ARTHROSCOPY W/ ROTATOR CUFF REPAIR      Family History  Problem Relation Age of Onset  . Diabetes Father   . Colon cancer Neg Hx   . Pancreatic cancer Neg Hx   . Rectal cancer Neg Hx   . Stomach cancer Neg Hx     Social History   Socioeconomic History  . Marital status: Married    Spouse name: Not on file  . Number of children: 1  . Years of education: 62  . Highest education level: Associate degree: occupational, Hotel manager, or vocational program  Occupational History  . 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 Smoking status: Never Smoker  . Smokeless tobacco: Never Used  Vaping Use  . Vaping Use: Never used  Substance and Sexual Activity  . Alcohol use: Not Currently  . Drug use: No  . Sexual activity: Not on file  Other Topics Concern  . 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 Difficulty of Paying Living Expenses: Not very hard  Food Insecurity: No Food Insecurity  . Worried About Charity fundraiser in the Last  Year: Never true  . Ran Out of Food in the Last Year: Never true  Transportation Needs: No Transportation Needs  . Lack of Transportation (Medical): No  . Lack of Transportation (Non-Medical): No  Physical Activity: Inactive  . Days of Exercise per Week: 0 days  . Minutes of Exercise per Session: 0 min  Stress: No Stress Concern Present  . Feeling of Stress : Not at all  Social Connections: Socially Integrated  . Frequency of Communication with Friends and Family: More than three times a week  . Frequency of Social Gatherings with Friends and Family: Twice a week  . Attends Religious Services: More than 4 times per year  . Active Member of Clubs or Organizations: Yes  . Attends Archivist Meetings: More than 4 times per year  . Marital Status: Married  Human resources officer Violence:   . Fear of Current or Ex-Partner: Not on file  . Emotionally Abused: Not on file  . Physically Abused: Not on file  . Sexually Abused: Not on file     PHYSICAL EXAM:  VS: BP 132/79   Pulse 69   Ht 5\' 6"  (1.676 m)   Wt 185 lb (83.9 kg)   BMI 29.86 kg/m  Physical Exam Gen: NAD, alert, cooperative with exam, well-appearing MSK:  Right thumb: No swelling or ecchymosis. Normal triggering exam. Neurovascular intact   ASSESSMENT & PLAN:   Trigger finger of right thumb Has been normal with splinting and injection. -Counseled on home exercise therapy and supportive care. -Counseled on splinting. -Could repeat injection if needed.

## 2020-04-27 ENCOUNTER — Emergency Department (HOSPITAL_BASED_OUTPATIENT_CLINIC_OR_DEPARTMENT_OTHER)
Admission: EM | Admit: 2020-04-27 | Discharge: 2020-04-27 | Disposition: A | Discharge status: Home / Self Care | Payer: Medicare Other | Attending: Emergency Medicine | Admitting: Emergency Medicine

## 2020-04-27 ENCOUNTER — Other Ambulatory Visit: Payer: Self-pay

## 2020-04-27 ENCOUNTER — Encounter (HOSPITAL_BASED_OUTPATIENT_CLINIC_OR_DEPARTMENT_OTHER): Payer: Self-pay | Admitting: Emergency Medicine

## 2020-04-27 DIAGNOSIS — M791 Myalgia, unspecified site: Secondary | ICD-10-CM | POA: Insufficient documentation

## 2020-04-27 DIAGNOSIS — Z5321 Procedure and treatment not carried out due to patient leaving prior to being seen by health care provider: Secondary | ICD-10-CM | POA: Diagnosis not present

## 2020-04-27 DIAGNOSIS — R519 Headache, unspecified: Secondary | ICD-10-CM | POA: Diagnosis not present

## 2020-04-27 DIAGNOSIS — R5383 Other fatigue: Secondary | ICD-10-CM | POA: Diagnosis not present

## 2020-04-27 DIAGNOSIS — Z20822 Contact with and (suspected) exposure to covid-19: Secondary | ICD-10-CM | POA: Insufficient documentation

## 2020-04-27 NOTE — ED Triage Notes (Signed)
Pt arrives pov with c/o HA, generalized body aches with fatigue x 3 days. Pt reports covid vaccine utd. Pt denies cough, endorses exposure to Covid

## 2020-04-28 LAB — SARS CORONAVIRUS 2 (TAT 6-24 HRS): SARS Coronavirus 2: NEGATIVE

## 2020-05-25 ENCOUNTER — Emergency Department (HOSPITAL_BASED_OUTPATIENT_CLINIC_OR_DEPARTMENT_OTHER)
Admission: EM | Admit: 2020-05-25 | Discharge: 2020-05-25 | Disposition: A | Discharge status: Home / Self Care | Payer: Medicare Other | Attending: Emergency Medicine | Admitting: Emergency Medicine

## 2020-05-25 ENCOUNTER — Other Ambulatory Visit: Payer: Self-pay

## 2020-05-25 ENCOUNTER — Encounter (HOSPITAL_BASED_OUTPATIENT_CLINIC_OR_DEPARTMENT_OTHER): Payer: Self-pay | Admitting: Emergency Medicine

## 2020-05-25 ENCOUNTER — Emergency Department (HOSPITAL_BASED_OUTPATIENT_CLINIC_OR_DEPARTMENT_OTHER): Payer: Medicare Other

## 2020-05-25 DIAGNOSIS — R0981 Nasal congestion: Secondary | ICD-10-CM

## 2020-05-25 DIAGNOSIS — U071 COVID-19: Secondary | ICD-10-CM | POA: Insufficient documentation

## 2020-05-25 DIAGNOSIS — R059 Cough, unspecified: Secondary | ICD-10-CM | POA: Diagnosis not present

## 2020-05-25 MED ORDER — ALBUTEROL SULFATE HFA 108 (90 BASE) MCG/ACT IN AERS
2.0000 | INHALATION_SPRAY | Freq: Once | RESPIRATORY_TRACT | Status: AC
  Administered 2020-05-25: 2 via RESPIRATORY_TRACT
  Filled 2020-05-25: qty 6.7

## 2020-05-25 MED ORDER — FLUTICASONE PROPIONATE 50 MCG/ACT NA SUSP: 1.0000 | Freq: Every day | NASAL | 0 refills | Status: DC

## 2020-05-25 MED ORDER — BENZONATATE 100 MG PO CAPS: 100.0000 mg | ORAL_CAPSULE | Freq: Three times a day (TID) | ORAL | 0 refills | Status: DC

## 2020-05-25 MED ORDER — PREDNISONE 10 MG PO TABS: 20.0000 mg | ORAL_TABLET | Freq: Every day | ORAL | 0 refills | Status: DC

## 2020-05-25 NOTE — Discharge Instructions (Signed)
You were seen in the ER today for cough and congestion.  Your chest x-ray did not show signs of pneumonia, there were some signs of bronchitis.  We suspect your symptoms are related to a virus or allergies, we have tested you for COVID-19 and we will call you if the results are positive.  Please quarantine at home until you have these results.  If they are positive please be sure to follow CDC guidelines for COVID-19 at this time.  We are sending home with the following medicines: -Flonase: Use 1 spray per nostril daily as needed for nasal congestion -Tessalon: Take every hours as needed for coughing -Albuterol inhaler: Use 1 to 2 puffs every 4-6 hours as needed for trouble breathing/wheezing -Prednisone-take 2 tablets daily for the next 5 days.  Take these in the morning with food as it can cause stomach upset and it or stomach bleeding.  They may also keep you up at night if you take them too late.  We have prescribed you new medication(s) today. Discuss the medications prescribed today with your pharmacist as they can have adverse effects and interactions with your other medicines including over the counter and prescribed medications. Seek medical evaluation if you start to experience new or abnormal symptoms after taking one of these medicines, seek care immediately if you start to experience difficulty breathing, feeling of your throat closing, facial swelling, or rash as these could be indications of a more serious allergic reaction  Please follow-up with your primary care provider within 3 days.  Return to the ER for new or worsening symptoms including but not limited to fever, chest pain, trouble breathing, passing out, coughing up blood, or any other concerns.

## 2020-05-25 NOTE — ED Triage Notes (Addendum)
COUGH and sinus drainage  X  X 4 days  Coughing so much makes her head hurt states has had all vaccines

## 2020-05-25 NOTE — ED Provider Notes (Signed)
[facility] EMERGENCY DEPARTMENT Provider Note   CSN: [id number] Arrival date & time: 05/25/20  1523     History Chief Complaint  Patient presents with  . Cough    Kathryn Phillips is a 70 y.o. female with a hx of allergic rhinitis, chronic cough, and obesity who presents to the ED with complaints of congestion & cough for the past 3-4 days. Patient states sxs started as nasal/sinus congestion with subsequent post nasal drip and cough. Cough is productive of clear sputum intermittently. She does feel she is wheezing at times. Has had a few loose stools. Denies fever, chills, chest pain, dyspnea, abdominal pain, nausea, vomiting, syncope, hemoptysis or leg pain/swelling.  No sick contacts w/ similar sxs. Vaccinated against covid 19.   HPI     Past Medical History:  Diagnosis Date  . BACK PAIN, UPPER 02/18/2009  . Cough     Patient Active Problem List   Diagnosis Date Noted  . Trigger finger of right thumb 03/11/2020  . Osteopenia after menopause 05/14/2019  . Class 1 obesity with body mass index (BMI) of 31.0 to 31.9 in adult 04/23/2019  . Insomnia 04/23/2019  . 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 Allergic rhinitis 07/19/2010  . BACK PAIN, UPPER 02/18/2009  . ARM PAIN, LEFT 11/07/2007    Past Surgical History:  Procedure Laterality Date  . CESAREAN SECTION    . myonectomy    . NASAL SINUS SURGERY    . SHOULDER ARTHROSCOPY W/ ROTATOR CUFF REPAIR       OB History   No obstetric history on file.     Family History  Problem Relation Age of Onset  . Diabetes Father   . Colon cancer Neg Hx   . Pancreatic cancer Neg Hx   . Rectal cancer Neg Hx   . Stomach cancer Neg Hx     Social History   Tobacco Use  . Smoking status: Never Smoker  . Smokeless tobacco: Never Used  Vaping Use  . Vaping Use: Never used  Substance Use Topics  . Alcohol use: Not Currently  . Drug use: No    Home Medications Prior to Admission medications   Medication Sig Start Date  End Date Taking? Authorizing Provider  acetaminophen (TYLENOL) 100 MG/ML solution Take 10 mg/kg by mouth every 4 (four) hours as needed. Patient used this medication for pain.    [provider]  fluticasone (FLONASE) 50 MCG/ACT nasal spray Place 2 sprays into both nostrils daily. 01/08/20   Martinique, Betty G, MD  Multiple Vitamin (MULTIVITAMIN) tablet Take 1 tablet by mouth daily.    [provider]    Allergies    Hydrocodone  Review of Systems   Review of Systems  Constitutional: Negative for chills and fever.  HENT: Positive for congestion and rhinorrhea. Negative for ear pain.   Respiratory: Positive for cough and wheezing. Negative for shortness of breath.   Cardiovascular: Negative for chest pain and leg swelling.  Gastrointestinal: Positive for diarrhea. Negative for abdominal pain, nausea and vomiting.  Genitourinary: Negative for dysuria.  Neurological: Negative for syncope.  All other systems reviewed and are negative.   Physical Exam Updated Vital Signs BP (!) 154/85 (BP Location: Right Arm)   Pulse 94   Temp 97.7 F (36.5 C) (Oral)   Resp 14   Ht 5\' 6"  (1.676 m)   Wt 83.9 kg   SpO2 96%   BMI 29.86 kg/m   Physical Exam Vitals and nursing note reviewed.  Constitutional:      General: She is not in acute distress.    Appearance: She is well-developed.  HENT:     Head: Normocephalic and atraumatic.     Right Ear: Ear canal normal. Tympanic membrane is not perforated, erythematous, retracted or bulging.     Left Ear: Ear canal normal. Tympanic membrane is not perforated, erythematous, retracted or bulging.     Ears:     Comments: No mastoid erythema/swelling/tenderness.     Nose: Mucosal edema and congestion present.     Right Sinus: No maxillary sinus tenderness or frontal sinus tenderness.     Left Sinus: No maxillary sinus tenderness or frontal sinus tenderness.     Mouth/Throat:     Pharynx: Uvula midline. No oropharyngeal exudate or  posterior oropharyngeal erythema.     Comments: Posterior oropharynx is symmetric appearing. Patient tolerating own secretions without difficulty. No trismus. No drooling. No hot potato voice. No swelling beneath the tongue, submandibular compartment is soft.  Eyes:     General:        Right eye: No discharge.        Left eye: No discharge.     Conjunctiva/sclera: Conjunctivae normal.     Pupils: Pupils are equal, round, and reactive to light.  Cardiovascular:     Rate and Rhythm: Normal rate and regular rhythm.     Heart sounds: No murmur heard.   Pulmonary:     Effort: Pulmonary effort is normal. No respiratory distress.     Breath sounds: Wheezing (mild end expiratory wheeze) present. No rhonchi or rales.  Abdominal:     General: There is no distension.     Palpations: Abdomen is soft.     Tenderness: There is no abdominal tenderness.  Musculoskeletal:     Cervical back: Normal range of motion and neck supple. No edema or rigidity.  Lymphadenopathy:     Cervical: No cervical adenopathy.  Skin:    General: Skin is warm and dry.     Findings: No rash.  Neurological:     Mental Status: She is alert.  Psychiatric:        Behavior: Behavior normal.     ED Results / Procedures / Treatments   Labs (all labs ordered are listed, but only abnormal results are displayed) Labs Reviewed - No data to display  EKG None  Radiology DG Chest Portable 1 View  Result Date: 05/25/2020 CLINICAL DATA:  Cough EXAM: PORTABLE CHEST 1 VIEW COMPARISON:  06/09/2019 FINDINGS: Surgical hardware in the cervical spine. No focal opacity or pleural effusion. Normal cardiomediastinal silhouette. No pneumothorax. Mild bronchitic changes. IMPRESSION: Mild bronchitic changes. No focal pulmonary infiltrate. Electronically Signed   By: Donavan Foil M.D.   On: 05/25/2020 16:44    Procedures Procedures   Medications Ordered in ED Medications - No data to display  ED Course  I have reviewed the triage  vital signs and the nursing notes.  Pertinent labs & imaging results that were available during my care of the patient were reviewed by me and considered in my medical decision making (see chart for details).    MDM Rules/Calculators/A&P                         Patient presents to the ED with complaints of congestion/cough.  Nontoxic, vitals without significant abnormality.   Additional history obtained:  Additional history obtained from chart review & nursing note review.   Lab Tests:  I Ordered a COVID test which is pending.   Imaging Studies ordered:  CXR ordered by triage, I independently visualized and interpreted imaging which showed Mild bronchitic changes. No focal pulmonary infiltrate  ED Course:  Exam is without signs of AOM, AOE, or mastoiditis. Oropharyngeal exam is benign. No sinus tenderness, sxs < 10 days, afebrile- low suspicion for acute bacterial sinusitis at this time. No meningeal signs. CXR without infiltrate doubt CAP. Patient does have mild end expiratory wheeze w/ findings of bronchitic changes on CXR, no respiratory distress/increased work of breathing.  Suspect viral versus allergic with bronchitic component.  Will tx with flonase, tessalon, inhaler, and low dose of steroids. COVID test ordered. I discussed results, treatment plan, need for follow-up, and return precautions with the patient. Provided opportunity for questions, patient confirmed understanding and is in agreement with plan.   Portions of this note were generated with Lobby[employer]. Dictation errors may occur despite best attempts at proofreading.   Final Clinical Impression(s) / ED Diagnoses Final diagnoses:  Cough with congestion of paranasal sinus    Rx / DC Orders ED Discharge Orders    None       Amaryllis Dyke, PA-C 05/26/20 0046    Little, Wenda Overland, MD 05/27/20 1529

## 2020-05-25 NOTE — ED Notes (Signed)
Patient verbalizes understanding of discharge instructions. Opportunity for questioning and answers were provided. Armband removed by staff, pt discharged from ED ambulatory to home.  

## 2020-05-26 LAB — SARS CORONAVIRUS 2 (TAT 6-24 HRS): SARS Coronavirus 2: POSITIVE — AB

## 2020-05-27 ENCOUNTER — Telehealth: Payer: Self-pay | Admitting: Adult Health

## 2020-05-27 NOTE — Telephone Encounter (Signed)
Called to discuss with patient about COVID-19 symptoms and the use of one of the available treatments for those with mild to moderate Covid symptoms and at a high risk of hospitalization.  Pt appears to qualify for outpatient treatment due to co-morbid conditions and/or a member of an at-risk group in accordance with the FDA Emergency Use Authorization.    Symptom onset: 05/21/20 Vaccinated: Yes  Booster? Yes  Immunocompromised? No  Qualifiers: Yes   Unable to reach pt - Reached pt 05/27/2020  , some better from ER on steroids . Sx >5 days fully vaccinated. Continue supportive care.   Please contact PCP  for sooner follow up if symptoms do not improve or worsen or seek emergency care    Barnes Florek NP -C

## 2020-06-10 ENCOUNTER — Encounter: Payer: Self-pay | Admitting: Family Medicine

## 2020-06-10 ENCOUNTER — Telehealth (INDEPENDENT_AMBULATORY_CARE_PROVIDER_SITE_OTHER): Payer: Medicare Other | Admitting: Family Medicine

## 2020-06-10 VITALS — Ht 66.0 in

## 2020-06-10 DIAGNOSIS — J989 Respiratory disorder, unspecified: Secondary | ICD-10-CM

## 2020-06-10 DIAGNOSIS — R059 Cough, unspecified: Secondary | ICD-10-CM | POA: Diagnosis not present

## 2020-06-10 DIAGNOSIS — J988 Other specified respiratory disorders: Secondary | ICD-10-CM

## 2020-06-10 MED ORDER — BUDESONIDE-FORMOTEROL FUMARATE 160-4.5 MCG/ACT IN AERO: 2.0000 | INHALATION_SPRAY | Freq: Two times a day (BID) | RESPIRATORY_TRACT | 0 refills | Status: DC

## 2020-06-10 MED ORDER — GUAIFENESIN-CODEINE 100-10 MG/5ML PO SOLN: 5.0000 mL | Freq: Two times a day (BID) | ORAL | 0 refills | Status: AC | PRN

## 2020-06-10 MED ORDER — AZITHROMYCIN 250 MG PO TABS: ORAL_TABLET | ORAL | 0 refills | Status: DC

## 2020-06-10 NOTE — Progress Notes (Signed)
Virtual Visit via Video Note I connected with Kathryn Phillips on 06/10/20 by a video enabled telemedicine application and verified that I am speaking with the correct person using two identifiers.  Location patient: home Location provider:work office Persons participating in the virtual visit: patient, provider  I discussed the limitations of evaluation and management by telemedicine and the availability of in person appointments. The patient expressed understanding and agreed to proceed.  Chief Complaint  Patient presents with  . Cough    HPI: Kathryn Phillips is a 70 yo female with hx of insomnia and back pain c/o persistent cough. She was evaluated in the ER on 05/25/20 because cough and congestion. Cough stated around 05/21/20, she did not have other cold like symptoms. Negative for fever,chills,changes in appetite. + Fatigue.  Sore throat and feeling SOB when coughing.  + Wheezing. No Hx of asthma or tobacco use.  She received Rx for Albuterol inh and Prednisone, she feels better the first week. She has used Albuterol inh a couple times, it seems to help. Benzonatate did not help.  Chest was "sore" with coughing spells. CXR done 05/25/20: Mild bronchitic changes, No focal pulmonary infiltrate.  Gradually improving but cough is still interfering with sleep. Robitussin did not help. Started Coricidin yesterday and it helped some.  Listed in problem list is chronic cough. Negative for heartburn,nausea,vomiting, changes in bowel habits,or abdominal pain.  ROS: See pertinent positives and negatives per HPI.  Past Medical History:  Diagnosis Date  . BACK PAIN, UPPER 02/18/2009  . Cough     Past Surgical History:  Procedure Laterality Date  . CESAREAN SECTION    . myonectomy    . NASAL SINUS SURGERY    . SHOULDER ARTHROSCOPY W/ ROTATOR CUFF REPAIR      Family History  Problem Relation Age of Onset  . Diabetes Father   . Colon cancer Neg Hx   . Pancreatic cancer Neg Hx   .  Rectal cancer Neg Hx   . Stomach cancer Neg Hx     Social History   Socioeconomic History  . Marital status: Married    Spouse name: Not on file  . Number of children: 1  . Years of education: 14  . Highest education level: Associate degree: occupational, Hotel manager, or vocational program  Occupational History  . 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 Smoking status: Never Smoker  . Smokeless tobacco: Never Used  Vaping Use  . Vaping Use: Never used  Substance and Sexual Activity  . Alcohol use: Not Currently  . Drug use: No  . Sexual activity: Not on file  Other Topics Concern  . 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  acetaminophen (TYLENOL) 100 MG/ML solution, Take 10 mg/kg by mouth every 4 (four) hours as needed. Patient used this medication for pain., Disp: , Rfl:  .  azithromycin (ZITHROMAX) 250 MG tablet, 2 tabs day one then 1 tab daily for 4 days., Disp: 6 tablet, Rfl: 0 .  budesonide-formoterol (SYMBICORT) 160-4.5 MCG/ACT inhaler, Inhale 2 puffs into the lungs 2 (two) times daily., Disp: 1 each, Rfl: 0 .  fluticasone (FLONASE) 50 MCG/ACT nasal spray, Place 1 spray into both nostrils daily., Disp: 16 g, Rfl: 0 .  guaiFENesin-codeine 100-10 MG/5ML syrup, Take 5 mLs by mouth 2 (two) times daily as needed for up to 10 days for cough., Disp: 100 mL, Rfl: 0 .  Multiple Vitamin (MULTIVITAMIN) tablet, Take 1 tablet by mouth daily., Disp: , Rfl:   EXAM:  VITALS per patient if applicable:Ht 5\' 6"  (1.676 m)   BMI 29.86 kg/m   GENERAL:  alert, oriented, appears well and in no acute distress  HEENT: atraumatic, conjunctiva clear, no obvious abnormalities on inspection of external nose and ears  NECK: normal movements of the head and neck  LUNGS: on inspection no signs of respiratory distress, breathing rate appears normal, no obvious gross SOB, gasping or wheezing. Productive cough a couple time during visit.  CV: no obvious cyanosis  MS: moves all visible extremities without noticeable abnormality  PSYCH/NEURO: pleasant and cooperative, no obvious depression or anxiety, speech and thought processing grossly intact  ASSESSMENT AND PLAN:  Discussed the following assessment and plan:  Reactive airway disease without asthma - Plan: budesonide-formoterol (SYMBICORT) 160-4.5 MCG/ACT inhaler Albuterol inh 2 puff every 6 hours for a week then as needed for wheezing or shortness of breath.  We discussed some side effects of Prednisone, recently completed course, so we will hold on this for now. She agrees with trying Symbicort 160-4.5 mcg bid for 30 days. Swish mouth after use.  Cough - Plan: guaiFENesin-codeine 100-10 MG/5ML syrup We discussed possible causes of chronic and acute cough. Side effects of codeine discussed, recommend taking med at bedtime.  Explained that cough can last a few more days and even weeks after acute symptoms have resolved. If not resolved in 2 weeks we can consider repeating CXR.  Respiratory tract infection - Plan: azithromycin (ZITHROMAX) 250 MG tablet I do not think abx is needed at this time. Recommend to hold on abx and to start if not any better in 5 days of if symptoms get worse. Instructed about warning signs.  We discussed possible serious and likely etiologies, options for evaluation and workup, limitations of telemedicine visit vs in person visit, treatment, treatment risks and precautions. She was advised to call back or seek an in-person evaluation if the symptoms worsen or if the  condition fails to improve as anticipated. I discussed the assessment and treatment plan with the patient. Kathryn Phillips was provided an opportunity to ask questions and all were answered. She agreed with the plan and demonstrated an understanding of the instructions.  Return if symptoms worsen or fail to improve.   Kathryn Dobbins [place], MD

## 2020-06-17 ENCOUNTER — Ambulatory Visit (INDEPENDENT_AMBULATORY_CARE_PROVIDER_SITE_OTHER)
Admission: RE | Admit: 2020-06-17 | Discharge: 2020-06-17 | Disposition: A | Discharge status: Home / Self Care | Payer: Medicare Other | Source: Ambulatory Visit | Attending: Family Medicine | Admitting: Family Medicine

## 2020-06-17 ENCOUNTER — Other Ambulatory Visit: Payer: Self-pay

## 2020-06-17 ENCOUNTER — Other Ambulatory Visit: Payer: Self-pay | Admitting: Family Medicine

## 2020-06-17 DIAGNOSIS — J988 Other specified respiratory disorders: Secondary | ICD-10-CM | POA: Diagnosis not present

## 2020-06-17 DIAGNOSIS — R0600 Dyspnea, unspecified: Secondary | ICD-10-CM | POA: Diagnosis not present

## 2020-06-17 DIAGNOSIS — R059 Cough, unspecified: Secondary | ICD-10-CM

## 2020-06-17 DIAGNOSIS — J989 Respiratory disorder, unspecified: Secondary | ICD-10-CM

## 2020-06-17 NOTE — Telephone Encounter (Signed)
I spoke with pt - she will go get the x-ray within the hour or so.

## 2020-06-17 NOTE — Addendum Note (Signed)
Addended by: Rodrigo Ran on: 06/17/2020 12:53 PM   Modules accepted: Orders

## 2020-06-17 NOTE — Telephone Encounter (Signed)
Pt is calling in needing a refill on Rx guaifenesin-codeine 100-10 MG/5ML syrup stating that she only will have enough for tonight and would like to see if she need something different.  Pt would like to have a call back.   Pharm:  [facility] on [address]

## 2020-06-18 ENCOUNTER — Other Ambulatory Visit: Payer: Self-pay | Admitting: Family Medicine

## 2020-06-18 DIAGNOSIS — R059 Cough, unspecified: Secondary | ICD-10-CM

## 2020-06-19 ENCOUNTER — Telehealth: Payer: Self-pay | Admitting: Family Medicine

## 2020-06-19 NOTE — Telephone Encounter (Signed)
I have not authorized  refills on codeine because it is not to take for more than 10 days,it is a controlled medication. Rx was supposed to last until 06/20/20. CXR showed clear lungs. If cough is persistent and inhalers have not helped, we can try short course of Omeprazole 40 mg daily for possible GERD. Pulmonology consultation will also be appropriate. Thanks, BJ

## 2020-06-19 NOTE — Telephone Encounter (Signed)
Patient states that she was told there was a refill on her cough syrup prescription at the pharmacy, however when she went to pick it up they didn't have it.    Also, patient states her xray results came back as having an upper respiratory tract infection.  Dr. [place] told her when she was in here to try the other meds first, which she has done.  Now that she has the infection, she is wondering if she is supposed to go ahead and take the antibiotic.  Patient is requesting a call back.

## 2020-06-19 NOTE — Telephone Encounter (Signed)
I spoke with pt. She will start the Zpak and take the plain mucinex. She will let me know if she isn't improved by Monday.

## 2020-06-29 ENCOUNTER — Telehealth: Payer: Self-pay | Admitting: Family Medicine

## 2020-06-29 NOTE — Telephone Encounter (Signed)
Patient is calling and stated that she is still experiencing congestion after the medication and wanted to see if there is something else she can take, please advise. CB is [phone]

## 2020-07-01 NOTE — Telephone Encounter (Signed)
I left pt a voicemail with the info below & advised her to call back with any questions. 

## 2020-07-01 NOTE — Telephone Encounter (Signed)
For nasal congestion she can try Nasocort or Flonase nasal spray once daily and nasal saline irrigations. For productive cough, if still has it, plain Mucinex and may take time to resolved. Monitor for new symptoms. Thanks, BJ

## 2020-07-07 ENCOUNTER — Other Ambulatory Visit: Payer: Self-pay | Admitting: Family Medicine

## 2020-07-07 DIAGNOSIS — J989 Respiratory disorder, unspecified: Secondary | ICD-10-CM

## 2021-01-09 ENCOUNTER — Ambulatory Visit (INDEPENDENT_AMBULATORY_CARE_PROVIDER_SITE_OTHER): Payer: Medicare Other

## 2021-01-09 ENCOUNTER — Other Ambulatory Visit: Payer: Self-pay

## 2021-01-09 DIAGNOSIS — Z0000 Encounter for general adult medical examination without abnormal findings: Secondary | ICD-10-CM

## 2021-01-09 NOTE — Progress Notes (Deleted)
I connected with  Kathryn Phillips on 01/09/21 by a video enabled telemedicine application and verified that I am speaking with the correct person using two identifiers.   I discussed the limitations of evaluation and management by telemedicine. The patient expressed understanding and agreed to proceed.   Location of Patient: Home Location of Provider: Office     Subjective:   Kathryn Phillips is a 70 y.o. female who presents for an Initial Medicare Annual Wellness Visit.  Review of Systems           Objective:    Today's Vitals   01/09/21 1124  PainSc: 0-No pain   There is no height or weight on file to calculate BMI.  Advanced Directives 04/27/2020 06/09/2019 05/08/2019 09/12/2013  Does Patient Have a Medical Advance Directive? No No No Patient does not have advance directive  Would patient like information on creating a medical advance directive? - Yes (ED - Information included in AVS) No - Patient declined -  Pre-existing out of facility DNR order (yellow form or pink MOST form) - - - No    Current Medications (verified) Outpatient Encounter Medications as of 01/09/2021  Medication Sig   acetaminophen (TYLENOL) 100 MG/ML solution Take 10 mg/kg by mouth every 4 (four) hours as needed. Patient used this medication for pain.   azithromycin (ZITHROMAX) 250 MG tablet 2 tabs day one then 1 tab daily for 4 days.   fluticasone (FLONASE) 50 MCG/ACT nasal spray Place 1 spray into both nostrils daily.   Multiple Vitamin (MULTIVITAMIN) tablet Take 1 tablet by mouth daily.   SYMBICORT 160-4.5 MCG/ACT inhaler INHALE 2 PUFFS INTO THE LUNGS TWICE A DAY   No facility-administered encounter medications on file as of 01/09/2021.    Allergies (verified) Hydrocodone   History: Past Medical History:  Diagnosis Date   BACK PAIN, UPPER 02/18/2009   Cough    Past Surgical History:  Procedure Laterality Date   CESAREAN SECTION     myonectomy     NASAL SINUS SURGERY     SHOULDER  ARTHROSCOPY W/ ROTATOR CUFF REPAIR     Family History  Problem Relation Age of Onset   Diabetes Father    Colon cancer Neg Hx    Pancreatic cancer Neg Hx    Rectal cancer Neg Hx    Stomach cancer Neg Hx    Social History   Socioeconomic History   Marital status: Married    Spouse name: Not on file   Number of children: 1   Years of education: 14   Highest education level: Associate degree: occupational, Hotel manager, or vocational program  Occupational History   Occupation: self employed    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  Tobacco Use   Smoking status: Never   Smokeless tobacco: Never  Vaping Use   Vaping Use: Never used  Substance and Sexual Activity   Alcohol use: Not Currently   Drug use: No   Sexual activity: Not on file  Other Topics Concern   Not on file  Social History Narrative   Married   1 son   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   Social Determinants of Health   Financial Resource Strain: Low Risk    Difficulty of Paying Living Expenses: Not very hard  Food Insecurity: No Food Insecurity   Worried About Running Out of Food in the Last Year: Never true   [place] in the Last Year: Never true  Transportation Needs: Not on file  Physical Activity: Not on file  Stress: Not on file  Social Connections: Socially Integrated   Frequency of Communication with Friends and Family: More than three times a week   Frequency of Social Gatherings with Friends and Family: Three times a week   Attends Religious Services: More than 4 times per year   Active Member of Clubs or Organizations: Yes   Attends Music therapist: More than 4 times per year   Marital Status: Married    Tobacco Counseling Counseling given: Not Answered   Clinical Intake:     Pain : 0-10 Pain Score: 0-No pain     Diabetes: No     Diabetic? No         Activities of Daily Living In your present state of health, do you have  any difficulty performing the following activities: 01/09/2021  Hearing? N  Vision? Y  Difficulty concentrating or making decisions? N  Walking or climbing stairs? N  Dressing or bathing? N  Doing errands, shopping? N  Preparing Food and eating ? N  Using the Toilet? N  In the past six months, have you accidently leaked urine? N  Do you have problems with loss of bowel control? N  Managing your Medications? N  Managing your Finances? N  Some recent data might be hidden    Patient Care Team: [place]ique, Betty G, MD as PCP - General (Family Medicine)  Indicate any recent Medical Services you may have received from other than [facility] providers in the past year (date may be approximate).     Assessment:   This is a routine wellness examination for Kathryn Phillips.  Hearing/Vision screen No results found.  Dietary issues and exercise activities discussed: Current Exercise Habits: Home exercise routine, Type of exercise: walking, Time (Minutes): 45, Frequency (Times/Week): 2, Weekly Exercise (Minutes/Week): 90, Intensity: Mild, Exercise limited by: None identified   Goals Addressed   None    Depression Screen PHQ 2/9 Scores 01/09/2021 05/08/2019 04/23/2019  PHQ - 2 Score 0 0 0    Fall Risk Fall Risk  01/09/2021 05/08/2019 04/23/2019  Falls in the past year? 0 0 0  Number falls in past yr: - - 0  Injury with Fall? - - 0  Risk for fall due to : - No Fall Risks -  Follow up Falls evaluation completed Falls evaluation completed;Education provided;Falls prevention discussed Education provided    FALL RISK PREVENTION PERTAINING TO THE HOME:  Any stairs in or around the home? {YES/NO:21197} If so, are there any without handrails? {YES/NO:21197} Home free of loose throw rugs in walkways, pet beds, electrical cords, etc? {YES/NO:21197} Adequate lighting in your home to reduce risk of falls? {YES/NO:21197}  ASSISTIVE DEVICES UTILIZED TO PREVENT FALLS:  Life alert? {YES/NO:21197} Use of a cane,  walker or w/c? {YES/NO:21197} Grab bars in the bathroom? {YES/NO:21197} Shower chair or bench in shower? {YES/NO:21197} Elevated toilet seat or a handicapped toilet? {YES/NO:21197}  TIMED UP AND GO:  Was the test performed? {YES/NO:21197}.  Length of time to ambulate 10 feet: *** sec.   {Appearance of [id number]  Cognitive Function:     6CIT Screen 01/09/2021 05/08/2019  What Year? 0 points 0 points  What month? 0 points 0 points  What time? 0 points 0 points  Count back from 20 0 points 0 points  Months in reverse 0 points 0 points  Repeat phrase 0 points 0 points  Total Score 0 0    Immunizations  Immunization History  Administered Date(s) Administered   Fluad Quad(high Dose 65+) 04/23/2019   Moderna Sars-Covid-2 Vaccination 05/26/2019, 06/23/2019   Pneumococcal Polysaccharide-23 04/23/2019   Td 05/02/1994    {TDAP status:2101805}  {Flu Vaccine status:2101806}  {Pneumococcal vaccine status:2101807}  {Covid-19 vaccine status:2101808}  Qualifies for Shingles Vaccine? {YES/NO:21197}  Zostavax completed {YES/NO:21197}  {Shingrix Completed?:2101804}  Screening Tests Health Maintenance  Topic Date Due   Zoster Vaccines- Shingrix (1 of 2) Never done   TETANUS/TDAP  05/02/2004   COVID-19 Vaccine (3 - Moderna risk series) 07/21/2019   PNA vac Low Risk Adult (2 of 2 - PCV13) 04/22/2020   INFLUENZA VACCINE  11/30/2020   MAMMOGRAM  11/20/2021   COLONOSCOPY (Pts 45-59yr Insurance coverage will need to be confirmed)  09/26/2023   DEXA SCAN  Completed   Hepatitis C Screening  Completed   HPV VACCINES  Aged Out    Health Maintenance  Health Maintenance Due  Topic Date Due   Zoster Vaccines- Shingrix (1 of 2) Never done   TETANUS/TDAP  05/02/2004   COVID-19 Vaccine (3 - Moderna risk series) 07/21/2019   PNA vac Low Risk Adult (2 of 2 - PCV13) 04/22/2020   INFLUENZA VACCINE  11/30/2020    {Colorectal cancer screening:2101809}  {Mammogram  status:21018020}  {Bone Density status:21018021}  Lung Cancer Screening: (Low Dose CT Chest recommended if Age 75-80 years, 30 pack-year currently smoking OR have quit w/in 15years.) {DOES NOT does:27190::"does not"} qualify.   Lung Cancer Screening Referral: ***  Additional Screening:  Hepatitis C Screening: {DOES NOT does:27190::"does not"} qualify; Completed ***  Vision Screening: Recommended annual ophthalmology exams for early detection of glaucoma and other disorders of the eye. Is the patient up to date with their annual eye exam?  {YES/NO:21197} Who is the provider or what is the name of the office in which the patient attends annual eye exams? *** If pt is not established with a provider, would they like to be referred to a provider to establish care? {YES/NO:21197}.   Dental Screening: Recommended annual dental exams for proper oral hygiene  Community Resource Referral / Chronic Care Management: CRR required this visit?  {YES/NO:21197}  CCM required this visit?  {YES/NO:21197}     Plan:     I have personally reviewed and noted the following in the patient's chart:   Medical and social history Use of alcohol, tobacco or illicit drugs  Current medications and supplements including opioid prescriptions. {Opioid Prescriptions:3408866648} Functional ability and status Nutritional status Physical activity Advanced directives List of other physicians Hospitalizations, surgeries, and ER visits in previous 12 months Vitals Screenings to include cognitive, depression, and falls Referrals and appointments  In addition, I have reviewed and discussed with patient certain preventive protocols, quality metrics, and best practice recommendations. A written personalized care plan for preventive services as well as general preventive health recommendations were provided to patient.     DAllegra Grana CMA   01/09/2021   Nurse Notes: ***       Subjective:   ESamarie Hantmanis a 70y.o. female who presents for Medicare Annual (Subsequent) preventive examination.  Review of Systems    ***       Objective:    Today's Vitals   01/09/21 1124  PainSc: 0-No pain   There is no height or weight on file to calculate BMI.  Advanced Directives 04/27/2020 06/09/2019 05/08/2019 09/12/2013  Does Patient Have a Medical Advance Directive? No No No Patient does not have advance directive  Would patient like information on creating a medical advance directive? - Yes (ED - Information included in AVS) No - Patient declined -  Pre-existing out of facility DNR order (yellow form or pink MOST form) - - - No    Current Medications (verified) Outpatient Encounter Medications as of 01/09/2021  Medication Sig   acetaminophen (TYLENOL) 100 MG/ML solution Take 10 mg/kg by mouth every 4 (four) hours as needed. Patient used this medication for pain.   azithromycin (ZITHROMAX) 250 MG tablet 2 tabs day one then 1 tab daily for 4 days.   fluticasone (FLONASE) 50 MCG/ACT nasal spray Place 1 spray into both nostrils daily.   Multiple Vitamin (MULTIVITAMIN) tablet Take 1 tablet by mouth daily.   SYMBICORT 160-4.5 MCG/ACT inhaler INHALE 2 PUFFS INTO THE LUNGS TWICE A DAY   No facility-administered encounter medications on file as of 01/09/2021.    Allergies (verified) Hydrocodone   History: Past Medical History:  Diagnosis Date   BACK PAIN, UPPER 02/18/2009   Cough    Past Surgical History:  Procedure Laterality Date   CESAREAN SECTION     myonectomy     NASAL SINUS SURGERY     SHOULDER ARTHROSCOPY W/ ROTATOR CUFF REPAIR     Family History  Problem Relation Age of Onset   Diabetes Father    Colon cancer Neg Hx    Pancreatic cancer Neg Hx    Rectal cancer Neg Hx    Stomach cancer Neg Hx    Social History   Socioeconomic History   Marital status: Married    Spouse name: Not on file   Number of children: 1   Years of education: 14   Highest education  level: Associate degree: occupational, Hotel manager, or vocational program  Occupational History   Occupation: self employed    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  Tobacco Use   Smoking status: Never   Smokeless tobacco: Never  Vaping Use   Vaping Use: Never used  Substance and Sexual Activity   Alcohol use: Not Currently   Drug use: No   Sexual activity: Not on file  Other Topics Concern   Not on file  Social History Narrative   Married   1 son   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   Social Determinants of Health   Financial Resource Strain: Low Risk    Difficulty of Paying Living Expenses: Not very hard  Food Insecurity: No Food Insecurity   Worried About Charity fundraiser in the Last Year: Never true   Arboriculturist in the Last Year: Never true  Transportation Needs: Not on file  Physical Activity: Not on file  Stress: Not on file  Social Connections: Socially Integrated   Frequency of Communication with Friends and Family: More than three times a week   Frequency of Social Gatherings with Friends and Family: Three times a week   Attends Religious Services: More than 4 times per year   Active Member of Clubs or Organizations: Yes   Attends Music therapist: More than 4 times per year   Marital Status: Married    Tobacco Counseling Counseling given: Not Answered   Clinical Intake:     Pain : 0-10 Pain Score: 0-No pain     Diabetes: No     Diabetic?***         Activities of Daily Living In your present state of health, do you have any difficulty performing the  following activities: 01/09/2021  Hearing? N  Vision? Y  Difficulty concentrating or making decisions? N  Walking or climbing stairs? N  Dressing or bathing? N  Doing errands, shopping? N  Preparing Food and eating ? N  Using the Toilet? N  In the past six months, have you accidently leaked urine? N  Do you have problems with loss of bowel  control? N  Managing your Medications? N  Managing your Finances? N  Some recent data might be hidden    Patient Care Team: [place], Betty G, MD as PCP - General (Family Medicine)  Indicate any recent Medical Services you may have received from other than [facility] providers in the past year (date may be approximate).     Assessment:   This is a routine wellness examination for Kathryn Phillips.  Hearing/Vision screen No results found.  Dietary issues and exercise activities discussed: Current Exercise Habits: Home exercise routine, Type of exercise: walking, Time (Minutes): 45, Frequency (Times/Week): 2, Weekly Exercise (Minutes/Week): 90, Intensity: Mild, Exercise limited by: None identified   Goals Addressed   None    Depression Screen PHQ 2/9 Scores 01/09/2021 05/08/2019 04/23/2019  PHQ - 2 Score 0 0 0    Fall Risk Fall Risk  01/09/2021 05/08/2019 04/23/2019  Falls in the past year? 0 0 0  Number falls in past yr: - - 0  Injury with Fall? - - 0  Risk for fall due to : - No Fall Risks -  Follow up Falls evaluation completed Falls evaluation completed;Education provided;Falls prevention discussed Education provided    FALL RISK PREVENTION PERTAINING TO THE HOME:  Any stairs in or around the home? {YES/NO:21197} If so, are there any without handrails? {YES/NO:21197} Home free of loose throw rugs in walkways, pet beds, electrical cords, etc? {YES/NO:21197} Adequate lighting in your home to reduce risk of falls? {YES/NO:21197}  ASSISTIVE DEVICES UTILIZED TO PREVENT FALLS:  Life alert? No  Use of a cane, walker or w/c? No  Grab bars in the bathroom? Yes  Shower chair or bench in shower? Yes  Elevated toilet seat or a handicapped toilet? Yes   TIMED UP AND GO:  Was the test performed? No .   Cognitive Function:     6CIT Screen 01/09/2021 05/08/2019  What Year? 0 points 0 points  What month? 0 points 0 points  What time? 0 points 0 points  Count back from 20 0 points 0 points   Months in reverse 0 points 0 points  Repeat phrase 0 points 0 points  Total Score 0 0    Immunizations Immunization History  Administered Date(s) Administered   Fluad Quad(high Dose 65+) 04/23/2019   Moderna Sars-Covid-2 Vaccination 05/26/2019, 06/23/2019   Pneumococcal Polysaccharide-23 04/23/2019   Td 05/02/1994    TDAP status: Due, Education has been provided regarding the importance of this vaccine. Advised may receive this vaccine at local pharmacy or Health Dept. Aware to provide a copy of the vaccination record if obtained from local pharmacy or Health Dept. Verbalized acceptance and understanding.  Flu Vaccine status: Due, Education has been provided regarding the importance of this vaccine. Advised may receive this vaccine at local pharmacy or Health Dept. Aware to provide a copy of the vaccination record if obtained from local pharmacy or Health Dept. Verbalized acceptance and understanding.  Pneumococcal vaccine status: Due, Education has been provided regarding the importance of this vaccine. Advised may receive this vaccine at local pharmacy or Health Dept. Aware to provide a copy of the  vaccination record if obtained from local pharmacy or Health Dept. Verbalized acceptance and understanding.  Covid-19 vaccine status: Completed vaccines  Qualifies for Shingles Vaccine? Yes   Zostavax completed No   Shingrix Completed?: No.    Education has been provided regarding the importance of this vaccine. Patient has been advised to call insurance company to determine out of pocket expense if they have not yet received this vaccine. Advised may also receive vaccine at local pharmacy or Health Dept. Verbalized acceptance and understanding.  Screening Tests Health Maintenance  Topic Date Due   Zoster Vaccines- Shingrix (1 of 2) Never done   TETANUS/TDAP  05/02/2004   COVID-19 Vaccine (3 - Moderna risk series) 07/21/2019   PNA vac Low Risk Adult (2 of 2 - PCV13) 04/22/2020    INFLUENZA VACCINE  11/30/2020   MAMMOGRAM  11/20/2021   COLONOSCOPY (Pts 45-50yr Insurance coverage will need to be confirmed)  09/26/2023   DEXA SCAN  Completed   Hepatitis C Screening  Completed   HPV VACCINES  Aged Out    Health Maintenance  Health Maintenance Due  Topic Date Due   Zoster Vaccines- Shingrix (1 of 2) Never done   TETANUS/TDAP  05/02/2004   COVID-19 Vaccine (3 - Moderna risk series) 07/21/2019   PNA vac Low Risk Adult (2 of 2 - PCV13) 04/22/2020   INFLUENZA VACCINE  11/30/2020    Colorectal cancer screening: Type of screening: Colonoscopy. Completed 09/25/2013. Repeat every 10 years  Mammogram status: Completed 11/21/19. Repeat every year. Patient is scheduling her own mammogram  Bone Density status: Completed 05/08/19. Results reflect: Bone density results: NORMAL. Repeat every 2 years.  Lung Cancer Screening: (Low Dose CT Chest recommended if Age 182-80years, 30 pack-year currently smoking OR have quit w/in 15years.) does not qualify.   Lung Cancer Screening Referral: no  Additional Screening:  Hepatitis C Screening: does qualify; Completed 04/23/2019  Vision Screening: Recommended annual ophthalmology exams for early detection of glaucoma and other disorders of the eye. Is the patient up to date with their annual eye exam?  No  Who is the provider or what is the name of the office in which the patient attends annual eye exams? Patient will get established with an ophthalmologist. Only uses glasses for reading  If pt is not established with a provider, would they like to be referred to a provider to establish care? No .   Dental Screening: Recommended annual dental exams for proper oral hygiene  Community Resource Referral / Chronic Care Management: CRR required this visit?  No   CCM required this visit?  No      Plan:     I have personally reviewed and noted the following in the patient's chart:   Medical and social history Use of alcohol,  tobacco or illicit drugs  Current medications and supplements including opioid prescriptions.  Functional ability and status Nutritional status Physical activity Advanced directives List of other physicians Hospitalizations, surgeries, and ER visits in previous 12 months Vitals Screenings to include cognitive, depression, and falls Referrals and appointments  In addition, I have reviewed and discussed with patient certain preventive protocols, quality metrics, and best practice recommendations. A written personalized care plan for preventive services as well as general preventive health recommendations were provided to patient.     DAllegra Grana CMA   01/09/2021   Nurse Notes:   Non-Face to Face _45_minute visit  Ms. Rotan , Thank you for taking time to come for your Medicare Wellness  Visit. I appreciate your ongoing commitment to your health goals. Please review the following plan we discussed and let me know if I can assist you in the future.   These are the goals we discussed:  Goals      Have 3 meals a day     Increase physical activity     Goal is 150 minutes weekly      Make time for your self     Take care of yourself so you can take care of others!      Weight (lb) < 160 lb (72.6 kg)        This is a list of the screening recommended for you and due dates:  Health Maintenance  Topic Date Due   Zoster (Shingles) Vaccine (1 of 2) Never done   Tetanus Vaccine  05/02/2004   COVID-19 Vaccine (3 - Moderna risk series) 07/21/2019   Pneumonia vaccines (2 of 2 - PCV13) 04/22/2020   Flu Shot  11/30/2020   Mammogram  11/20/2021   Colon Cancer Screening  09/26/2023   DEXA scan (bone density measurement)  Completed   Hepatitis C Screening: USPSTF Recommendation to screen - Ages 4-79 yo.  Completed   HPV Vaccine  Aged Out

## 2021-01-09 NOTE — Progress Notes (Signed)
I connected with  Kathryn Phillips on 01/09/21 by a video enabled telemedicine application and verified that I am speaking with the correct person using two identifiers.   I discussed the limitations of evaluation and management by telemedicine. The patient expressed understanding and agreed to proceed.   Location of Patient: Home Location of Provider: Office    Subjective:   Kathryn Phillips is a 70 y.o. female who presents for Medicare Annual (Subsequent) preventive examination.  Review of Systems           Objective:    Today's Vitals   01/09/21 1124  PainSc: 0-No pain   There is no height or weight on file to calculate BMI.  Advanced Directives 04/27/2020 06/09/2019 05/08/2019 09/12/2013  Does Patient Have a Medical Advance Directive? No No No Patient does not have advance directive  Would patient like information on creating a medical advance directive? - Yes (ED - Information included in AVS) No - Patient declined -  Pre-existing out of facility DNR order (yellow form or pink MOST form) - - - No    Current Medications (verified) Outpatient Encounter Medications as of 01/09/2021  Medication Sig   acetaminophen (TYLENOL) 100 MG/ML solution Take 10 mg/kg by mouth every 4 (four) hours as needed. Patient used this medication for pain.   azithromycin (ZITHROMAX) 250 MG tablet 2 tabs day one then 1 tab daily for 4 days.   fluticasone (FLONASE) 50 MCG/ACT nasal spray Place 1 spray into both nostrils daily.   Multiple Vitamin (MULTIVITAMIN) tablet Take 1 tablet by mouth daily.   SYMBICORT 160-4.5 MCG/ACT inhaler INHALE 2 PUFFS INTO THE LUNGS TWICE A DAY   No facility-administered encounter medications on file as of 01/09/2021.    Allergies (verified) Hydrocodone   History: Past Medical History:  Diagnosis Date   BACK PAIN, UPPER 02/18/2009   Cough    Past Surgical History:  Procedure Laterality Date   CESAREAN SECTION     myonectomy     NASAL SINUS SURGERY      SHOULDER ARTHROSCOPY W/ ROTATOR CUFF REPAIR     Family History  Problem Relation Age of Onset   Diabetes Father    Colon cancer Neg Hx    Pancreatic cancer Neg Hx    Rectal cancer Neg Hx    Stomach cancer Neg Hx    Social History   Socioeconomic History   Marital status: Married    Spouse name: Not on file   Number of children: 1   Years of education: 14   Highest education level: Associate degree: occupational, Hotel manager, or vocational program  Occupational History   Occupation: self employed    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  Tobacco Use   Smoking status: Never   Smokeless tobacco: Never  Vaping Use   Vaping Use: Never used  Substance and Sexual Activity   Alcohol use: Not Currently   Drug use: No   Sexual activity: Not on file  Other Topics Concern   Not on file  Social History Narrative   Married   1 son   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   Social Determinants of Health   Financial Resource Strain: Low Risk    Difficulty of Paying Living Expenses: Not very hard  Food Insecurity: No Food Insecurity   Worried About Running Out of Food in the Last Year: Never true   Ran Out of Food in the Last Year: Never true  Transportation Needs:  No Transportation Needs   Lack of Transportation (Medical): No   Lack of Transportation (Non-Medical): No  Physical Activity: Insufficiently Active   Days of Exercise per Week: 2 days   Minutes of Exercise per Session: 40 min  Stress: Not on file  Social Connections: Socially Integrated   Frequency of Communication with Friends and Family: More than three times a week   Frequency of Social Gatherings with Friends and Family: Three times a week   Attends Religious Services: More than 4 times per year   Active Member of Clubs or Organizations: Yes   Attends Music therapist: More than 4 times per year   Marital Status: Married    Tobacco Counseling Counseling given: Not  Answered   Clinical Intake:     Pain : 0-10 Pain Score: 0-No pain     Diabetes: No     Diabetic? No         Activities of Daily Living In your present state of health, do you have any difficulty performing the following activities: 01/09/2021  Hearing? N  Vision? Y  Difficulty concentrating or making decisions? N  Walking or climbing stairs? N  Dressing or bathing? N  Doing errands, shopping? N  Preparing Food and eating ? N  Using the Toilet? N  In the past six months, have you accidently leaked urine? N  Do you have problems with loss of bowel control? N  Managing your Medications? N  Managing your Finances? N  Some recent data might be hidden    Patient Care Team: Martinique, Betty G, MD as PCP - General (Family Medicine)  Indicate any recent Medical Services you may have received from other than [facility] providers in the past year (date may be approximate).     Assessment:   This is a routine wellness examination for Kathryn Phillips.  Hearing/Vision screen No results found.  Dietary issues and exercise activities discussed: Current Exercise Habits: Home exercise routine, Type of exercise: walking, Time (Minutes): 45, Frequency (Times/Week): 2, Weekly Exercise (Minutes/Week): 90, Intensity: Mild, Exercise limited by: None identified   Goals Addressed   None    Depression Screen PHQ 2/9 Scores 01/09/2021 05/08/2019 04/23/2019  PHQ - 2 Score 0 0 0    Fall Risk Fall Risk  01/09/2021 05/08/2019 04/23/2019  Falls in the past year? 0 0 0  Number falls in past yr: - - 0  Injury with Fall? - - 0  Risk for fall due to : - No Fall Risks -  Follow up Falls evaluation completed Falls evaluation completed;Education provided;Falls prevention discussed Education provided    FALL RISK PREVENTION PERTAINING TO THE HOME:  Any stairs in or around the home? Yes  If so, are there any without handrails? No  Home free of loose throw rugs in walkways, pet beds, electrical cords, etc? No   Adequate lighting in your home to reduce risk of falls? Yes   ASSISTIVE DEVICES UTILIZED TO PREVENT FALLS:  Life alert? No  Use of a cane, walker or w/c? No  Grab bars in the bathroom? Yes  Shower chair or bench in shower? Yes  Elevated toilet seat or a handicapped toilet? Yes   TIMED UP AND GO:  Was the test performed? N/A    Cognitive Function:     6CIT Screen 01/09/2021 05/08/2019  What Year? 0 points 0 points  What month? 0 points 0 points  What time? 0 points 0 points  Count back from 20 0 points 0  points  Months in reverse 0 points 0 points  Repeat phrase 0 points 0 points  Total Score 0 0    Immunizations Immunization History  Administered Date(s) Administered   Fluad Quad(high Dose 65+) 04/23/2019   Moderna Sars-Covid-2 Vaccination 05/26/2019, 06/23/2019   Pneumococcal Polysaccharide-23 04/23/2019   Td 05/02/1994    TDAP status: Due, Education has been provided regarding the importance of this vaccine. Advised may receive this vaccine at local pharmacy or Health Dept. Aware to provide a copy of the vaccination record if obtained from local pharmacy or Health Dept. Verbalized acceptance and understanding.  Flu Vaccine status: Due, Education has been provided regarding the importance of this vaccine. Advised may receive this vaccine at local pharmacy or Health Dept. Aware to provide a copy of the vaccination record if obtained from local pharmacy or Health Dept. Verbalized acceptance and understanding.  Pneumococcal vaccine status: Due, Education has been provided regarding the importance of this vaccine. Advised may receive this vaccine at local pharmacy or Health Dept. Aware to provide a copy of the vaccination record if obtained from local pharmacy or Health Dept. Verbalized acceptance and understanding.  Covid-19 vaccine status: Completed vaccines  Qualifies for Shingles Vaccine? Yes   Zostavax completed No   Shingrix Completed?: No.    Education has been  provided regarding the importance of this vaccine. Patient has been advised to call insurance company to determine out of pocket expense if they have not yet received this vaccine. Advised may also receive vaccine at local pharmacy or Health Dept. Verbalized acceptance and understanding.  Screening Tests Health Maintenance  Topic Date Due   Zoster Vaccines- Shingrix (1 of 2) Never done   TETANUS/TDAP  05/02/2004   COVID-19 Vaccine (3 - Moderna risk series) 07/21/2019   PNA vac Low Risk Adult (2 of 2 - PCV13) 04/22/2020   INFLUENZA VACCINE  11/30/2020   MAMMOGRAM  11/20/2021   COLONOSCOPY (Pts 45-77yr Insurance coverage will need to be confirmed)  09/26/2023   DEXA SCAN  Completed   Hepatitis C Screening  Completed   HPV VACCINES  Aged Out    Health Maintenance  Health Maintenance Due  Topic Date Due   Zoster Vaccines- Shingrix (1 of 2) Never done   TETANUS/TDAP  05/02/2004   COVID-19 Vaccine (3 - Moderna risk series) 07/21/2019   PNA vac Low Risk Adult (2 of 2 - PCV13) 04/22/2020   INFLUENZA VACCINE  11/30/2020    Colorectal cancer screening: Type of screening: Colonoscopy. Completed 09/25/13. Repeat every 10 years  Mammogram status: Completed 11/21/19. Repeat every year. Patient will schedule her mammogram  Bone Density status: Completed 05/08/19. Results reflect: Bone density results: NORMAL. Repeat every 2 years.  Lung Cancer Screening: (Low Dose CT Chest recommended if Age 68-80years, 30 pack-year currently smoking OR have quit w/in 15years.) does not qualify.   Lung Cancer Screening Referral: No  Additional Screening:  Hepatitis C Screening: does qualify; Completed 04/23/19  Vision Screening: Recommended annual ophthalmology exams for early detection of glaucoma and other disorders of the eye. Is the patient up to date with their annual eye exam?  No  Who is the provider or what is the name of the office in which the patient attends annual eye exams? Patient will  establish with an ophthalmologist  If pt is not established with a provider, would they like to be referred to a provider to establish care? No .   Dental Screening: Recommended annual dental exams for proper oral hygiene  Community Resource Referral / Chronic Care Management: CRR required this visit?  No   CCM required this visit?  No      Plan:     I have personally reviewed and noted the following in the patient's chart:   Medical and social history Use of alcohol, tobacco or illicit drugs  Current medications and supplements including opioid prescriptions.  Functional ability and status Nutritional status Physical activity Advanced directives List of other physicians Hospitalizations, surgeries, and ER visits in previous 12 months Vitals Screenings to include cognitive, depression, and falls Referrals and appointments  In addition, I have reviewed and discussed with patient certain preventive protocols, quality metrics, and best practice recommendations. A written personalized care plan for preventive services as well as general preventive health recommendations were provided to patient.     Allegra Grana, CMA   01/09/2021   Nurse Notes: Non-Face to Face _45_ minute visit   Ms. Condrey , Thank you for taking time to come for your Medicare Wellness Visit. I appreciate your ongoing commitment to your health goals. Please review the following plan we discussed and let me know if I can assist you in the future.   These are the goals we discussed:  Goals      Have 3 meals a day     Increase physical activity     Goal is 150 minutes weekly      Make time for your self     Take care of yourself so you can take care of others!      Weight (lb) < 160 lb (72.6 kg)        This is a list of the screening recommended for you and due dates:  Health Maintenance  Topic Date Due   Zoster (Shingles) Vaccine (1 of 2) Never done   Tetanus Vaccine  05/02/2004   COVID-19  Vaccine (3 - Moderna risk series) 07/21/2019   Pneumonia vaccines (2 of 2 - PCV13) 04/22/2020   Flu Shot  11/30/2020   Mammogram  11/20/2021   Colon Cancer Screening  09/26/2023   DEXA scan (bone density measurement)  Completed   Hepatitis C Screening: USPSTF Recommendation to screen - Ages 65-79 yo.  Completed   HPV Vaccine  Aged Out

## 2021-01-12 IMAGING — MG DIGITAL SCREENING BILAT W/ TOMO W/ CAD
8 series · 8 of 24 positions shown · non-contrast
Comparison: Previous exam(s).

CLINICAL DATA: Screening.

EXAM:
DIGITAL SCREENING BILATERAL MAMMOGRAM WITH TOMO AND CAD

[L MLO synth-2D]
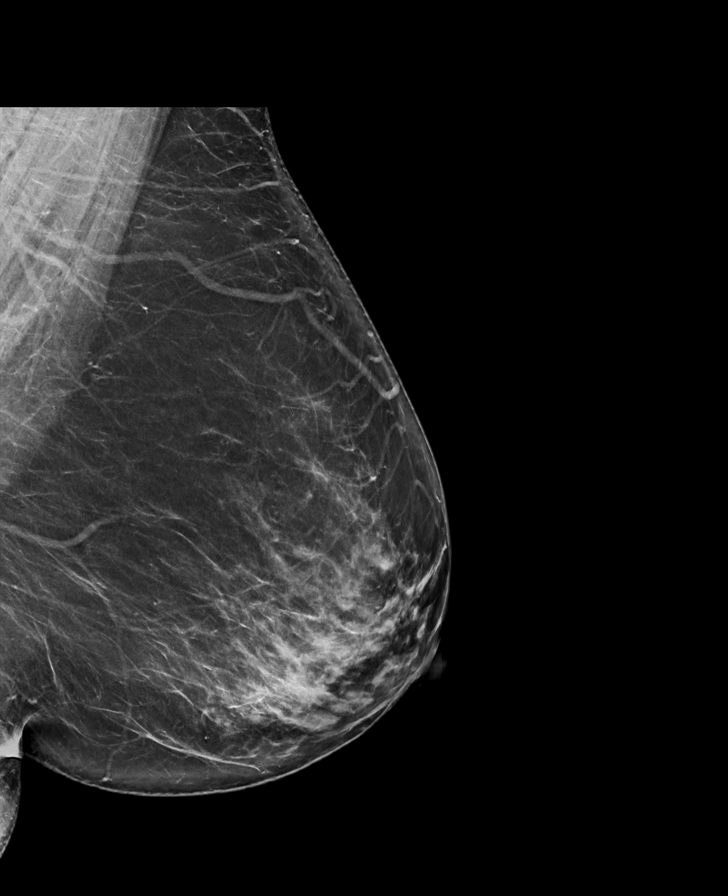

[L CC synth-2D]
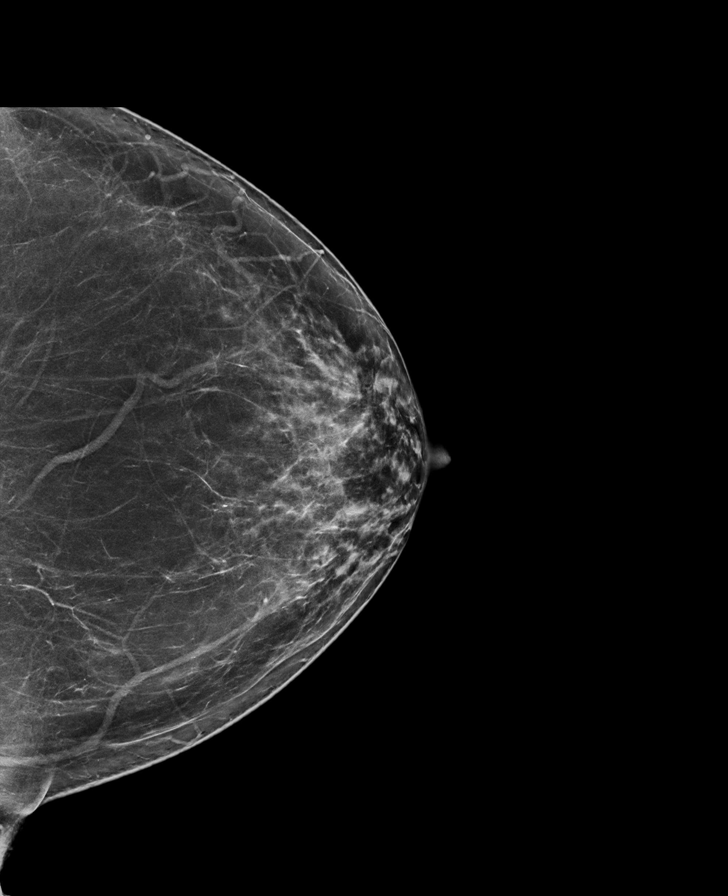

[R MLO synth-2D]
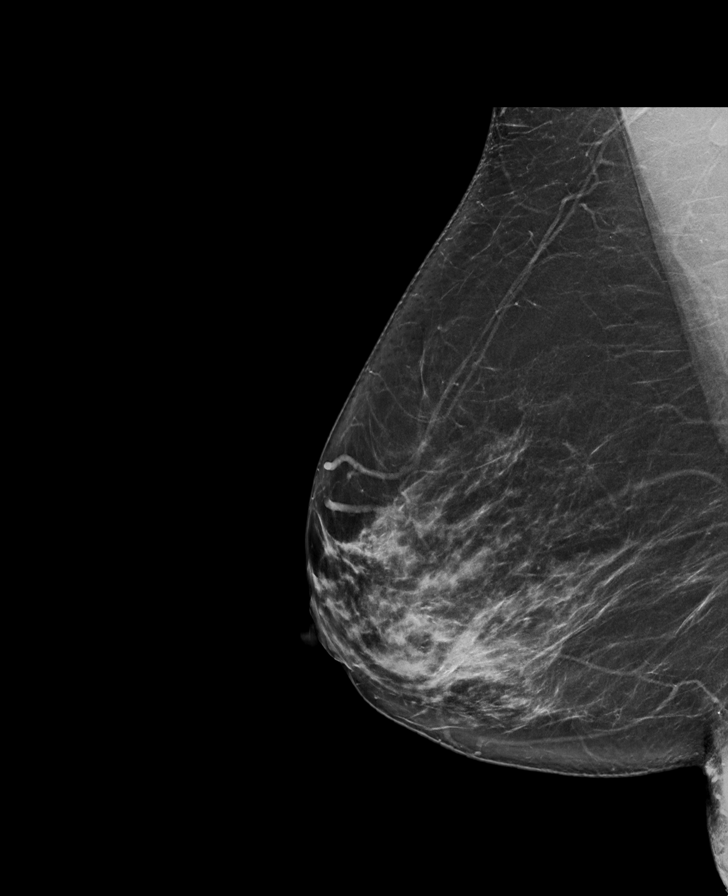

[R CC synth-2D]
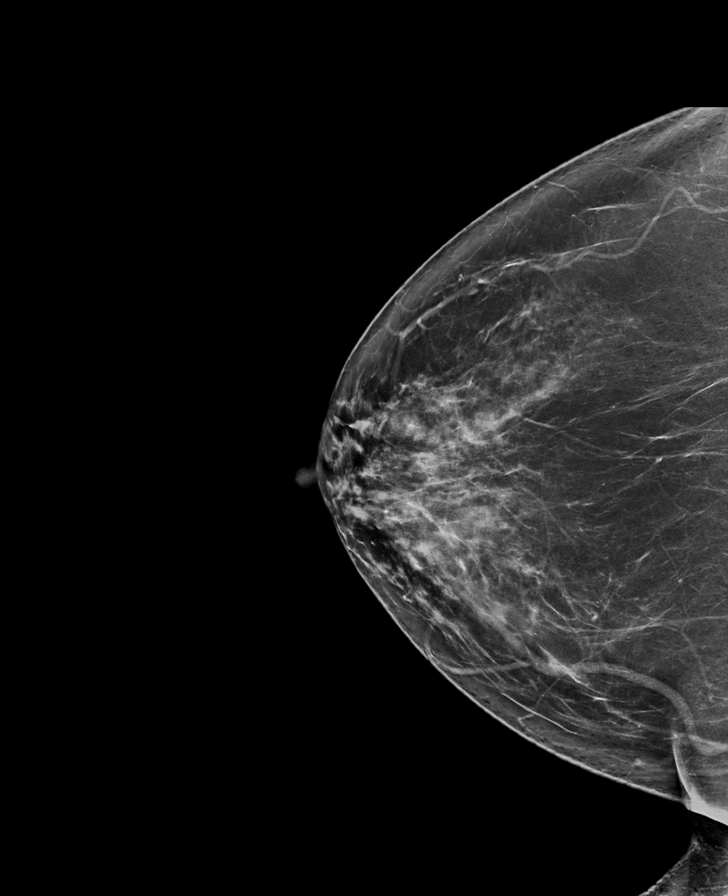

[L MLO tomo · tomo slice 43/84.0]
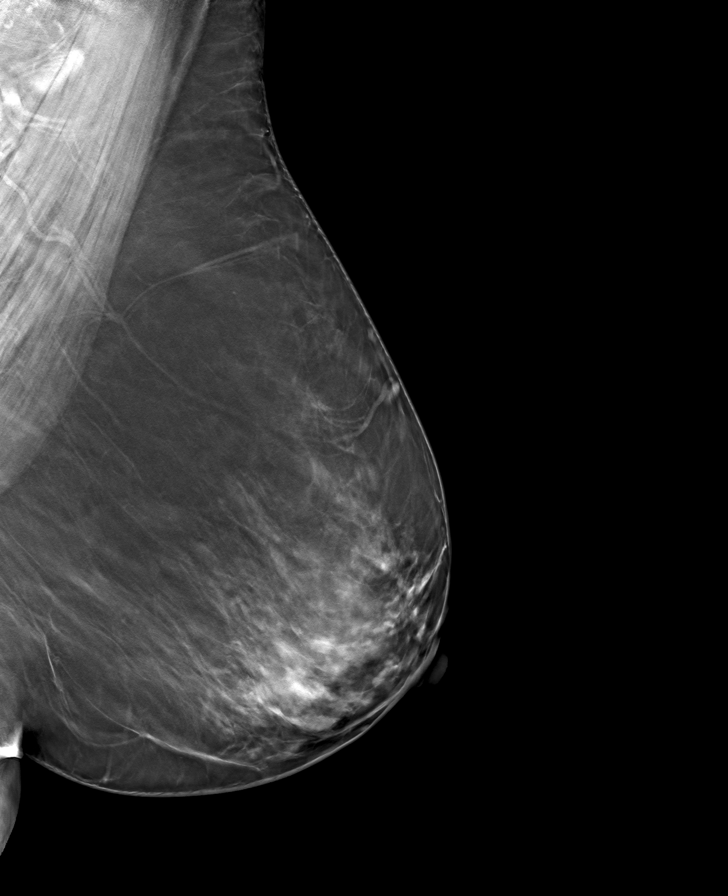

[R MLO tomo · tomo slice 41/81.0]
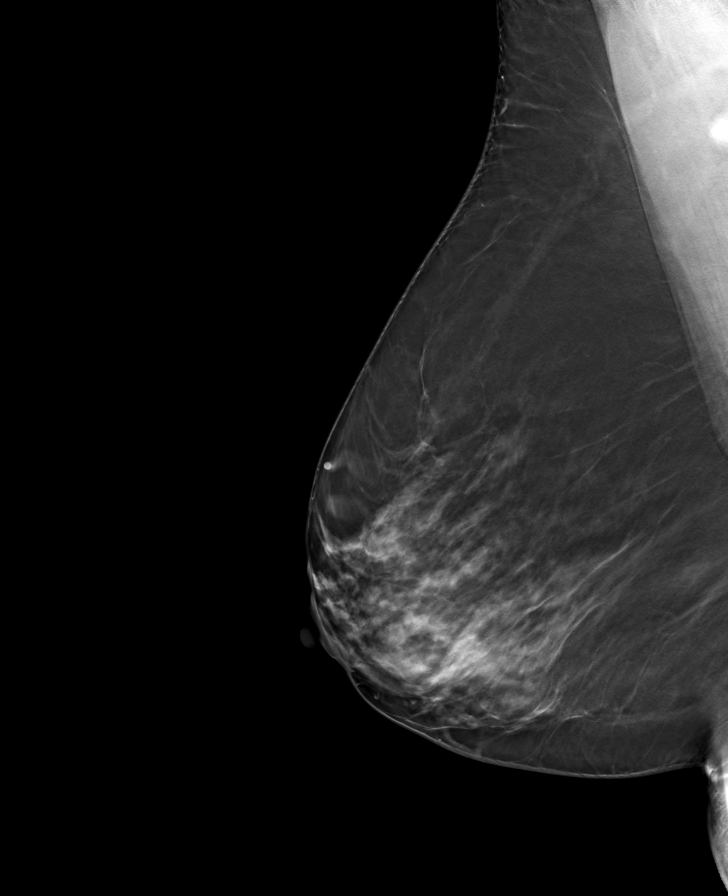

[L CC tomo · tomo slice 40/79.0]
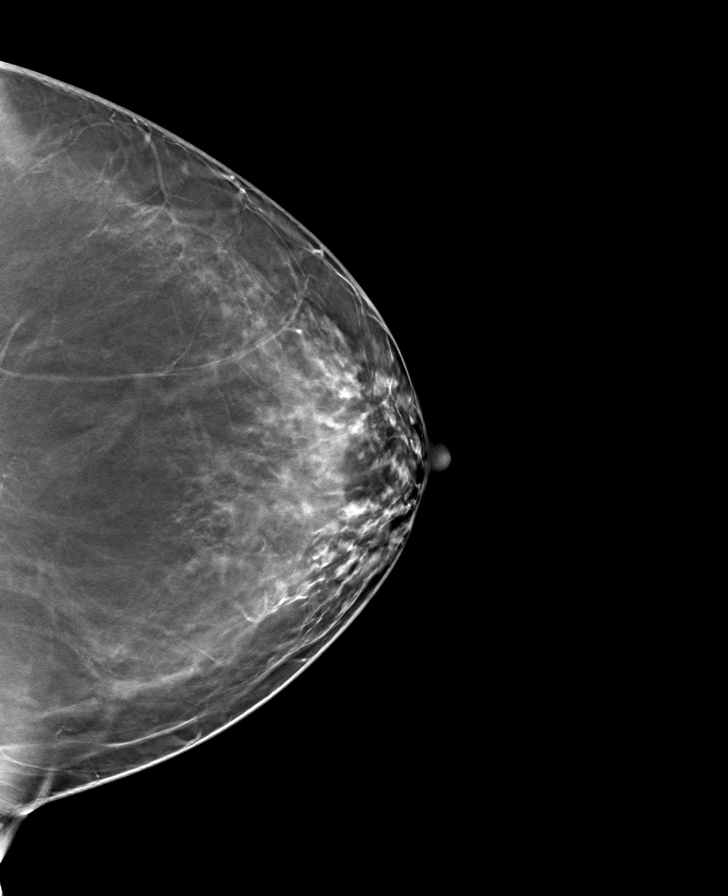

[R CC tomo · tomo slice 40/79.0]
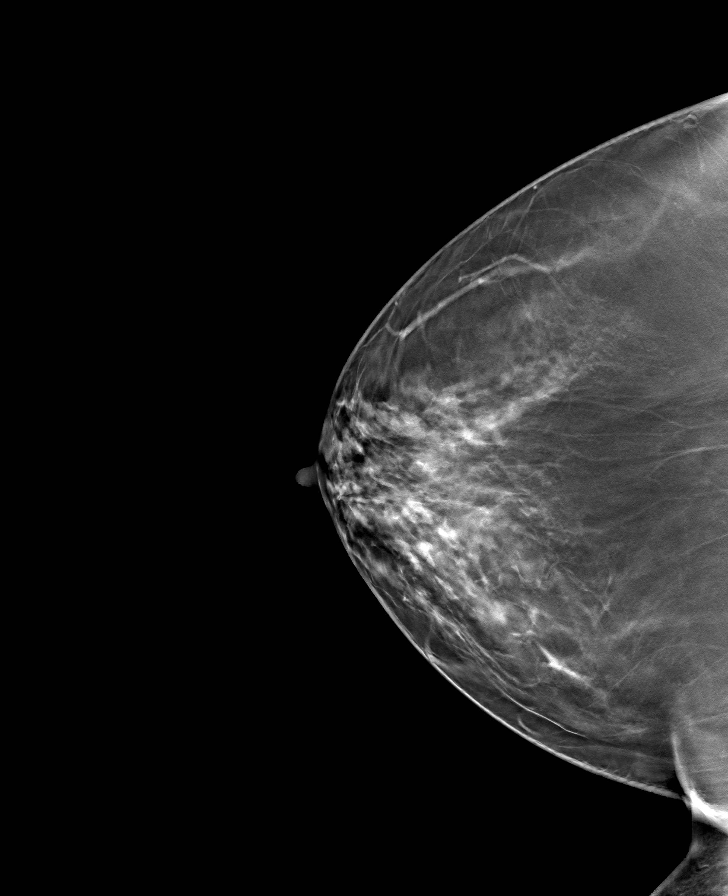

[8 of 24 positions shown; findings below may reference images not displayed]

ACR Breast Density Category c: The breast tissue is heterogeneously
dense, which may obscure small masses.
FINDINGS: There are no findings suspicious for malignancy. Images were
processed with CAD.
IMPRESSION: No mammographic evidence of malignancy. A result letter of this
screening mammogram will be mailed directly to the patient.

RECOMMENDATION:
Screening mammogram in one year. (Code:[id number]-U-LHB)

BI-RADS CATEGORY  1: Negative.

## 2021-07-17 IMAGING — DX DG CHEST 1V PORT
1 series · 1 of 1 positions shown · non-contrast
Comparison: 06/09/2019

CLINICAL DATA: Cough

EXAM:
PORTABLE CHEST 1 VIEW

[chest ap]
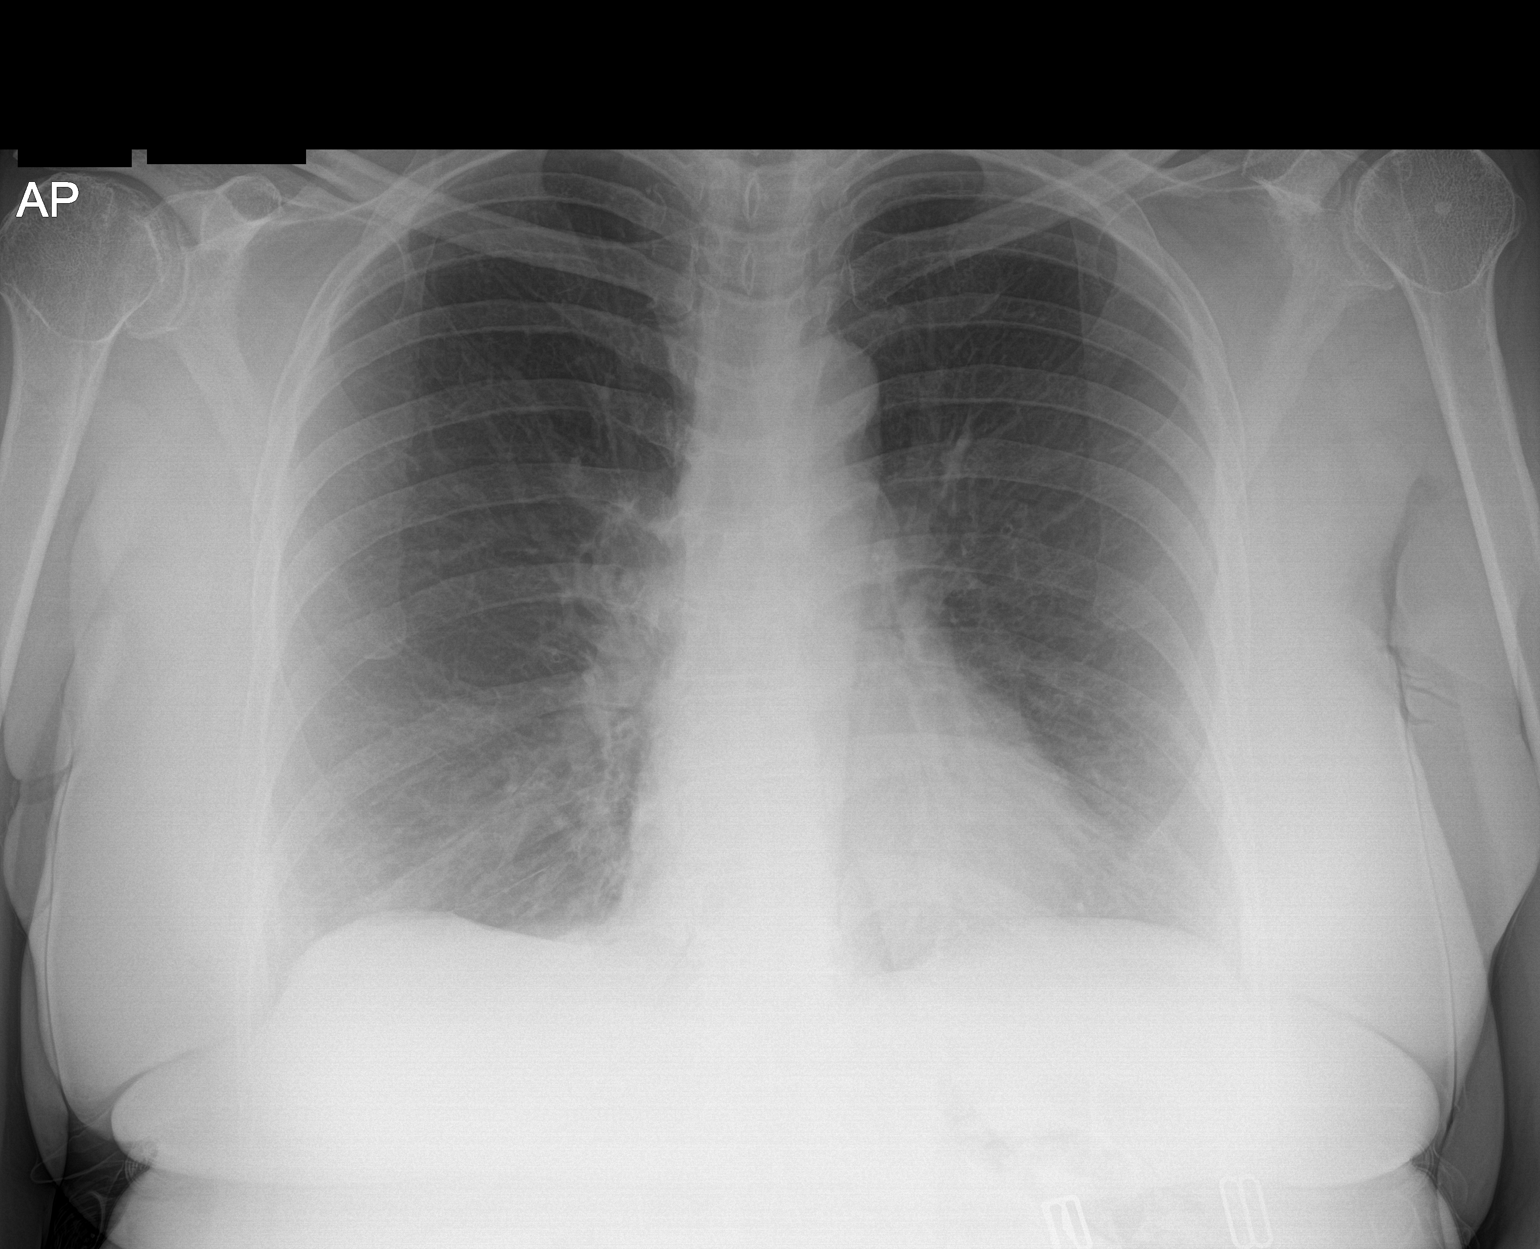

[1 of 1 positions shown; findings below may reference images not displayed]

FINDINGS: Surgical hardware in the cervical spine. No focal opacity or pleural
effusion. Normal cardiomediastinal silhouette. No pneumothorax. Mild
bronchitic changes.
IMPRESSION: Mild bronchitic changes. No focal pulmonary infiltrate.

## 2021-12-16 ENCOUNTER — Other Ambulatory Visit: Payer: Self-pay

## 2021-12-16 ENCOUNTER — Emergency Department (HOSPITAL_BASED_OUTPATIENT_CLINIC_OR_DEPARTMENT_OTHER): Payer: Medicare Other

## 2021-12-16 ENCOUNTER — Emergency Department (HOSPITAL_BASED_OUTPATIENT_CLINIC_OR_DEPARTMENT_OTHER)
Admission: EM | Admit: 2021-12-16 | Discharge: 2021-12-16 | Disposition: A | Discharge status: Home / Self Care | Payer: Medicare Other | Attending: Emergency Medicine | Admitting: Emergency Medicine

## 2021-12-16 ENCOUNTER — Encounter (HOSPITAL_BASED_OUTPATIENT_CLINIC_OR_DEPARTMENT_OTHER): Payer: Self-pay | Admitting: Emergency Medicine

## 2021-12-16 DIAGNOSIS — R051 Acute cough: Secondary | ICD-10-CM | POA: Diagnosis not present

## 2021-12-16 DIAGNOSIS — R0602 Shortness of breath: Secondary | ICD-10-CM | POA: Diagnosis not present

## 2021-12-16 DIAGNOSIS — J3489 Other specified disorders of nose and nasal sinuses: Secondary | ICD-10-CM | POA: Insufficient documentation

## 2021-12-16 DIAGNOSIS — R059 Cough, unspecified: Secondary | ICD-10-CM | POA: Diagnosis not present

## 2021-12-16 DIAGNOSIS — R0981 Nasal congestion: Secondary | ICD-10-CM | POA: Insufficient documentation

## 2021-12-16 MED ORDER — IPRATROPIUM-ALBUTEROL 0.5-2.5 (3) MG/3ML IN SOLN
3.0000 mL | Freq: Once | RESPIRATORY_TRACT | Status: AC
  Administered 2021-12-16: 3 mL via RESPIRATORY_TRACT
  Filled 2021-12-16: qty 3

## 2021-12-16 MED ORDER — AMOXICILLIN 500 MG PO CAPS: 500.0000 mg | ORAL_CAPSULE | Freq: Three times a day (TID) | ORAL | 0 refills | Status: DC

## 2021-12-16 MED ORDER — IBUPROFEN 200 MG PO TABS
600.0000 mg | ORAL_TABLET | Freq: Once | ORAL | Status: AC
Start: 2021-12-16 — End: 2021-12-16
  Administered 2021-12-16: 600 mg via ORAL
  Filled 2021-12-16: qty 1

## 2021-12-16 NOTE — ED Provider Notes (Signed)
[facility] Provider Note   CSN: [id number] Arrival date & time: 12/16/21  1720     History  Chief Complaint  Patient presents with   Nasal Congestion    Kathryn Phillips is a 71 y.o. female.  Patient presents to the hospital with chief complaint of nasal congestion.  She also complains of mild shortness of breath with cough and sinus pressure.  She states the symptoms all began approximately 3 weeks ago.  She denies any fevers at home.  Denies chest pain, abdominal pain, nausea, vomiting.  Endorses left-sided sinus pain.  Patient with past medical history significant for cough, allergic rhinitis.  No history of asthma or COPD.  HPI     Home Medications Prior to Admission medications   Medication Sig Start Date End Date Taking? Authorizing Provider  amoxicillin (AMOXIL) 500 MG capsule Take 1 capsule (500 mg total) by mouth 3 (three) times daily. 12/16/21  Yes Dorothyann Peng, PA-C  acetaminophen (TYLENOL) 100 MG/ML solution Take 10 mg/kg by mouth every 4 (four) hours as needed. Patient used this medication for pain.    [provider]  azithromycin (ZITHROMAX) 250 MG tablet 2 tabs day one then 1 tab daily for 4 days. 06/10/20   Martinique, Betty G, MD  fluticasone (FLONASE) 50 MCG/ACT nasal spray Place 1 spray into both nostrils daily. 05/25/20   Petrucelli, Samantha R, PA-C  Multiple Vitamin (MULTIVITAMIN) tablet Take 1 tablet by mouth daily.    [provider]  SYMBICORT 160-4.5 MCG/ACT inhaler INHALE 2 PUFFS INTO THE LUNGS TWICE A DAY 07/07/20   Martinique, Betty G, MD      Allergies    Hydrocodone    Review of Systems   Review of Systems  Constitutional:  Negative for fever.  HENT:  Positive for congestion, sinus pressure and sinus pain. Negative for facial swelling, postnasal drip and rhinorrhea.   Respiratory:  Positive for shortness of breath.   Cardiovascular:  Negative for chest pain.  Gastrointestinal:  Negative for abdominal  pain, nausea and vomiting.  Genitourinary:  Negative for dysuria.    Physical Exam Updated Vital Signs BP (!) 141/84   Pulse 91   Temp 97.8 F (36.6 C) (Oral)   Resp 17   SpO2 100%  Physical Exam Vitals and nursing note reviewed.  Constitutional:      General: She is not in acute distress. HENT:     Head: Normocephalic and atraumatic.     Right Ear: Tympanic membrane normal. No swelling. No middle ear effusion. No mastoid tenderness. Tympanic membrane is not injected.     Left Ear: Tympanic membrane normal. No swelling.  No middle ear effusion. No mastoid tenderness. Tympanic membrane is not injected.     Mouth/Throat:     Mouth: Mucous membranes are moist.  Eyes:     Conjunctiva/sclera: Conjunctivae normal.  Cardiovascular:     Rate and Rhythm: Normal rate and regular rhythm.     Pulses: Normal pulses.     Heart sounds: Normal heart sounds.  Pulmonary:     Effort: Pulmonary effort is normal.     Breath sounds: Rhonchi present.  Musculoskeletal:        General: Normal range of motion.     Cervical back: Normal range of motion and neck supple.  Skin:    General: Skin is warm and dry.     Capillary Refill: Capillary refill takes less than 2 seconds.  Neurological:     General: No  focal deficit present.     Mental Status: She is alert.  Psychiatric:        Behavior: Behavior normal.     ED Results / Procedures / Treatments   Labs (all labs ordered are listed, but only abnormal results are displayed) Labs Reviewed - No data to display  EKG None  Radiology DG Chest 2 View  Result Date: 12/16/2021 CLINICAL DATA:  Shortness of breath EXAM: CHEST - 2 VIEW COMPARISON:  06/17/2020 FINDINGS: The heart size and mediastinal contours are within normal limits. Both lungs are clear. There is anterior cervical fusion. IMPRESSION: No active cardiopulmonary disease. Electronically Signed   By: Elmer Picker M.D.   On: 12/16/2021 18:22    Procedures Procedures     Medications Ordered in ED Medications  ipratropium-albuterol (DUONEB) 0.5-2.5 (3) MG/3ML nebulizer solution 3 mL (3 mLs Nebulization Given 12/16/21 1834)  ibuprofen (ADVIL) tablet 600 mg (600 mg Oral Given 12/16/21 1842)    ED Course/ Medical Decision Making/ A&P                           Medical Decision Making Amount and/or Complexity of Data Reviewed Radiology: ordered.  Risk Prescription drug management.   Patient presents with a chief complaint of sinus congestion, cough.  Differential diagnosis includes sinus infection, pneumonia, viral infection, others  I reviewed the patient's past medical history including emergency department visits in 2022 for cough with congestion  I ordered and interpreted imaging including a chest x-ray.  No active cardiopulmonary disease.  No pneumonia noted.  I agree with the radiologist findings.  I ordered the patient a breathing treatment and ibuprofen.  Upon reassessment the patient was feeling better.  Her lungs were clear to auscultation.  Based on the patient's physical examination, this seems consistent with a sinus infection.  She has had symptoms at this point for 3 weeks.  I think would be reasonable to start the patient on antibiotic.  Plan to discharge patient home with antibiotic.  I considered labs at this visit but the patient does not appear toxic, has no fever, no signs of sepsis.  I see no utility in labs at this time.  Symptoms have been ongoing for 3 weeks so doubt COVID or influenza.  Discharge home with antibiotic and outpatient PCP follow-up.        Final Clinical Impression(s) / ED Diagnoses Final diagnoses:  Congestion of nasal sinus  Acute cough    Rx / DC Orders ED Discharge Orders          Ordered    amoxicillin (AMOXIL) 500 MG capsule  3 times daily        12/16/21 1836              Dorothyann Peng, PA-C 12/16/21 [place], [place], DO 12/16/21 1851

## 2021-12-16 NOTE — Discharge Instructions (Addendum)
You were seen today for a cough and sinus congestion.  I am sending in antibiotics to cover for a sinus infection.  Please complete the entire antibiotic course.  There were no signs of pneumonia on your chest x-ray.  I recommend following up with your primary care provider if your symptoms persist.  Return to the emergency department if you develop life-threatening conditions such as chest pain, shortness of breath

## 2021-12-16 NOTE — ED Triage Notes (Signed)
Patient presents to ED via POV from home. Patient reports congestion and facial pressure x3 weeks. Ambulatory with steady gait.

## 2021-12-16 NOTE — ED Notes (Signed)
Patient states that she is coughing up clear phlegm. Has nasal congestion. Denies any shortness of breath. Alert x4

## 2021-12-24 ENCOUNTER — Encounter: Payer: Self-pay | Admitting: Family Medicine

## 2021-12-24 ENCOUNTER — Ambulatory Visit (INDEPENDENT_AMBULATORY_CARE_PROVIDER_SITE_OTHER): Payer: Medicare Other | Admitting: Family Medicine

## 2021-12-24 VITALS — BP 142/82 | HR 74 | Temp 99.0°F | Wt 176.4 lb

## 2021-12-24 DIAGNOSIS — R0982 Postnasal drip: Secondary | ICD-10-CM | POA: Diagnosis not present

## 2021-12-24 DIAGNOSIS — J309 Allergic rhinitis, unspecified: Secondary | ICD-10-CM | POA: Diagnosis not present

## 2021-12-24 DIAGNOSIS — R052 Subacute cough: Secondary | ICD-10-CM | POA: Diagnosis not present

## 2021-12-24 MED ORDER — FLUTICASONE PROPIONATE 50 MCG/ACT NA SUSP: 1.0000 | Freq: Every day | NASAL | 1 refills | Status: DC

## 2021-12-24 MED ORDER — BENZONATATE 100 MG PO CAPS: 100.0000 mg | ORAL_CAPSULE | Freq: Two times a day (BID) | ORAL | 0 refills | Status: DC | PRN

## 2021-12-24 NOTE — Progress Notes (Signed)
Subjective:    Patient ID: [facility], female    DOB: 1950/08/30, 71 y.o.   MRN: [id number]  Chief Complaint  Patient presents with   Follow-up    Cough and nasal congestion, causing bad headaches. States not much better. Just finished the abx, is taking Dayquil and Nyquil.     HPI Patient is a 71 year old female with past medical history significant for allergies, osteopenia, insomnia who is followed by Dr. Martinique and was seen today for ongoing concern.  Patient seen at [facility] on 12/16/2021 for cough and nasal congestion.  Given amoxicillin 500 mg 3 times daily.  Patient states headache improved after starting ABX.  Patient endorses continued cough, nasal congestion/chest congestion.  Denies SOB, wheezing, fever, chills, nausea, vomiting, ear pain/pressure, facial pain/pressure, rhinorrhea, sick contacts.  Past Medical History:  Diagnosis Date   BACK PAIN, UPPER 02/18/2009   Cough     Allergies  Allergen Reactions   Hydrocodone Nausea And Vomiting    ROS General: Denies fever, chills, night sweats, changes in weight, changes in appetite HEENT: Denies headaches, ear pain, changes in vision, rhinorrhea, sore throat + nasal congestion.  HA-resolved CV: Denies CP, palpitations, SOB, orthopnea Pulm: Denies SOB, cough, wheezing  + cough, chest congestion GI: Denies abdominal pain, nausea, vomiting, diarrhea, constipation GU: Denies dysuria, hematuria, frequency, vaginal discharge Msk: Denies muscle cramps, joint pains Neuro: Denies weakness, numbness, tingling Skin: Denies rashes, bruising Psych: Denies depression, anxiety, hallucinations     Objective:    Blood pressure (!) 156/88, pulse 74, temperature 99 F (37.2 C), temperature source Oral, weight 176 lb 6.4 oz (80 kg), SpO2 95 %.  Gen. Pleasant, well-nourished, in no distress, normal affect   HEENT: [place]/AT, face symmetric, conjunctiva clear, no scleral icterus, PERRLA, EOMI, nares patent without drainage,  pharynx with clear postnasal drainage, no erythema or exudate.  TMs full bilaterally.  No cervical lymphadenopathy. Lungs: no accessory muscle use, rhonchi in bilateral bases. Cardiovascular: RRR, no m/r/g, no peripheral edema Neuro:  A&Ox3, CN II-XII intact, normal gait Skin:  Warm, no lesions/ rash   Wt Readings from Last 3 Encounters:  12/24/21 176 lb 6.4 oz (80 kg)  05/25/20 185 lb (83.9 kg)  04/27/20 190 lb (86.2 kg)    Lab Results  Component Value Date   WBC 4.7 02/07/2018   HGB 12.6 02/07/2018   HCT 37.8 02/07/2018   PLT 246.0 02/07/2018   GLUCOSE 113 (H) 04/23/2019   ALT 20 02/03/2013   AST 18 02/03/2013   NA 141 04/23/2019   K 4.4 04/23/2019   CL 106 04/23/2019   CREATININE 0.70 04/23/2019   BUN 12 04/23/2019   CO2 28 04/23/2019   HGBA1C 5.9 12/24/2019    Assessment/Plan:  Allergic rhinitis, unspecified seasonality, unspecified trigger  - Plan: fluticasone (FLONASE) 50 MCG/ACT nasal spray  Subacute cough  -Likely from postnasal drainage but also consider bronchitis - Plan: benzonatate (TESSALON) 100 MG capsule  Post-nasal drainage  - Plan: fluticasone (FLONASE) 50 MCG/ACT nasal spray  Discussed allergies likely contributing to nasal congestion and subsequently postnasal drainage contributing to increased cough.  Discussed supportive care including steam from shower, OTC cough medication/cough drops.  We will have patient start OTC antihistamine and use Flonase nasal spray or saline nasal rinse.  Patient has Zyrtec at home.  Rx for Flonase and Tessalon sent to pharmacy.  Given precautions.  For continued or worsening symptoms consider additional ABX as sinusitis could develop.  Also consider bronchitis.  F/u as needed  Grier Mitts, MD

## 2021-12-24 NOTE — Patient Instructions (Signed)
A prescription for Flonase was sent to your pharmacy.  Another prescription for Tessalon a pill for cough was also sent.  Try using Zyrtec or another allergy medication to help with the postnasal drainage.

## 2022-01-04 ENCOUNTER — Encounter (HOSPITAL_BASED_OUTPATIENT_CLINIC_OR_DEPARTMENT_OTHER): Payer: Self-pay

## 2022-01-04 ENCOUNTER — Emergency Department (HOSPITAL_BASED_OUTPATIENT_CLINIC_OR_DEPARTMENT_OTHER): Payer: Medicare Other

## 2022-01-04 ENCOUNTER — Emergency Department (HOSPITAL_BASED_OUTPATIENT_CLINIC_OR_DEPARTMENT_OTHER)
Admission: EM | Admit: 2022-01-04 | Discharge: 2022-01-04 | Disposition: A | Discharge status: Home / Self Care | Payer: Medicare Other | Attending: Emergency Medicine | Admitting: Emergency Medicine

## 2022-01-04 ENCOUNTER — Other Ambulatory Visit: Payer: Self-pay

## 2022-01-04 DIAGNOSIS — R059 Cough, unspecified: Secondary | ICD-10-CM | POA: Diagnosis not present

## 2022-01-04 DIAGNOSIS — R0789 Other chest pain: Secondary | ICD-10-CM | POA: Diagnosis not present

## 2022-01-04 DIAGNOSIS — Z7952 Long term (current) use of systemic steroids: Secondary | ICD-10-CM | POA: Diagnosis not present

## 2022-01-04 DIAGNOSIS — J189 Pneumonia, unspecified organism: Secondary | ICD-10-CM | POA: Insufficient documentation

## 2022-01-04 DIAGNOSIS — Z7951 Long term (current) use of inhaled steroids: Secondary | ICD-10-CM | POA: Insufficient documentation

## 2022-01-04 MED ORDER — DOXYCYCLINE HYCLATE 100 MG PO TABS
100.0000 mg | ORAL_TABLET | Freq: Once | ORAL | Status: AC
  Administered 2022-01-04: 100 mg via ORAL
  Filled 2022-01-04: qty 1

## 2022-01-04 MED ORDER — ALBUTEROL SULFATE HFA 108 (90 BASE) MCG/ACT IN AERS
2.0000 | INHALATION_SPRAY | Freq: Once | RESPIRATORY_TRACT | Status: AC
  Administered 2022-01-04: 2 via RESPIRATORY_TRACT
  Filled 2022-01-04: qty 6.7

## 2022-01-04 MED ORDER — DOXYCYCLINE HYCLATE 100 MG PO CAPS: 100.0000 mg | ORAL_CAPSULE | Freq: Two times a day (BID) | ORAL | 0 refills | Status: DC

## 2022-01-04 MED ORDER — PREDNISONE 20 MG PO TABS
20.0000 mg | ORAL_TABLET | Freq: Once | ORAL | Status: AC
  Administered 2022-01-04: 20 mg via ORAL
  Filled 2022-01-04: qty 1

## 2022-01-04 MED ORDER — PREDNISONE 20 MG PO TABS: 20.0000 mg | ORAL_TABLET | Freq: Every day | ORAL | 0 refills | Status: AC

## 2022-01-04 NOTE — ED Provider Notes (Signed)
[facility] EMERGENCY DEPARTMENT Provider Note   CSN: [id number] Arrival date & time: 01/04/22  1147     History  Chief Complaint  Patient presents with   Cough    Kathryn Phillips is a 71 y.o. female.  Patient here with cough for the last several weeks.  Was put on antibiotics couple weeks ago but has not gotten better.  No history of COPD or asthma.  Has had bad bronchitis in the past.  Denies any chest pain or shortness of breath or weakness or numbness or chills.  Still mostly with cough but no fever.  The history is provided by the patient.       Home Medications Prior to Admission medications   Medication Sig Start Date End Date Taking? Authorizing Provider  doxycycline (VIBRAMYCIN) 100 MG capsule Take 1 capsule (100 mg total) by mouth 2 (two) times daily for 10 days. 01/04/22 01/14/22 Yes Desiderio Dolata, DO  predniSONE (DELTASONE) 20 MG tablet Take 1 tablet (20 mg total) by mouth daily for 5 days. 01/04/22 01/09/22 Yes Sherilyn Windhorst, DO  acetaminophen (TYLENOL) 100 MG/ML solution Take 10 mg/kg by mouth every 4 (four) hours as needed. Patient used this medication for pain.    [provider]  amoxicillin (AMOXIL) 500 MG capsule Take 1 capsule (500 mg total) by mouth 3 (three) times daily. Patient not taking: Reported on 12/24/2021 12/16/21   Cherlynn June B, PA-C  azithromycin (ZITHROMAX) 250 MG tablet 2 tabs day one then 1 tab daily for 4 days. 06/10/20   Martinique, Betty G, MD  benzonatate (TESSALON) 100 MG capsule Take 1 capsule (100 mg total) by mouth 2 (two) times daily as needed for cough. 12/24/21   Billie Ruddy, MD  fluticasone (FLONASE) 50 MCG/ACT nasal spray Place 1 spray into both nostrils daily. 12/24/21   Billie Ruddy, MD  Multiple Vitamin (MULTIVITAMIN) tablet Take 1 tablet by mouth daily.    [provider]  SYMBICORT 160-4.5 MCG/ACT inhaler INHALE 2 PUFFS INTO THE LUNGS TWICE A DAY 07/07/20   Martinique, Betty G, MD      Allergies     Hydrocodone    Review of Systems   Review of Systems  Physical Exam Updated Vital Signs BP 125/76 (BP Location: Left Arm)   Pulse 86   Temp 98.1 F (36.7 C) (Oral)   Resp 20   Ht '5\' 6"'$  (1.676 m)   Wt 78.9 kg   SpO2 95%   BMI 28.08 kg/m  Physical Exam Vitals and nursing note reviewed.  Constitutional:      General: She is not in acute distress.    Appearance: She is well-developed.  HENT:     Head: Normocephalic and atraumatic.     Mouth/Throat:     Mouth: Mucous membranes are moist.  Eyes:     Extraocular Movements: Extraocular movements intact.     Conjunctiva/sclera: Conjunctivae normal.     Pupils: Pupils are equal, round, and reactive to light.  Cardiovascular:     Rate and Rhythm: Normal rate and regular rhythm.     Pulses: Normal pulses.     Heart sounds: No murmur heard. Pulmonary:     Effort: Pulmonary effort is normal. No respiratory distress.     Breath sounds: Normal breath sounds.     Comments: Coarse breath sounds with mild wheeze Abdominal:     Palpations: Abdomen is soft.     Tenderness: There is no abdominal tenderness.  Musculoskeletal:  General: No swelling.     Cervical back: Neck supple.  Skin:    General: Skin is warm and dry.     Capillary Refill: Capillary refill takes less than 2 seconds.  Neurological:     Mental Status: She is alert.  Psychiatric:        Mood and Affect: Mood normal.     ED Results / Procedures / Treatments   Labs (all labs ordered are listed, but only abnormal results are displayed) Labs Reviewed - No data to display  EKG None  Radiology DG Chest 2 View  Result Date: 01/04/2022 CLINICAL DATA:  cough x 3 weeks, chest tightness EXAM: CHEST - 2 VIEW COMPARISON:  Chest x-ray 12/16/2021. FINDINGS: Right mid basilar airspace opacity. No visible pleural effusions or pneumothorax. Cardiomediastinal silhouette is within normal limits. ACDF. IMPRESSION: Right mid basilar airspace opacity, suspicious for  pneumonia. Followup PA and lateral chest X-ray is recommended in 3-4 weeks following trial of antibiotic therapy to ensure resolution and exclude underlying malignancy. Electronically Signed   By: Margaretha Sheffield M.D.   On: 01/04/2022 12:17    Procedures Procedures    Medications Ordered in ED Medications  albuterol (VENTOLIN HFA) 108 (90 Base) MCG/ACT inhaler 2 puff (2 puffs Inhalation Given 01/04/22 1510)  predniSONE (DELTASONE) tablet 20 mg (20 mg Oral Given 01/04/22 1502)  doxycycline (VIBRA-TABS) tablet 100 mg (100 mg Oral Given 01/04/22 1502)    ED Course/ Medical Decision Making/ A&P                           Medical Decision Making Amount and/or Complexity of Data Reviewed Radiology: ordered.  Risk Prescription drug management.   Kathryn Phillips is here with cough.  No significant comorbidities.  Normal vitals.  No fever.  No history of smoking or asthma.  Has some coarse breath sounds and some mild wheezing on exam but overall well-appearing.  Normal vitals.  No fever.  Chest x-ray today per my review and interpretation shows pneumonia.  She had a chest x-ray couple weeks ago that was unremarkable.  We will start her on doxycycline however do think that this could be a bronchitis with a upper respiratory symptoms and wheezing.  We will prescribe low-dose of steroids and albuterol as well.  I have no concern for PE or ACS or other acute process.  She is well-appearing.  I do recommend that she follow-up with her primary care doctor for repeat x-ray as if x-ray still has abnormality may need CT scan to further rule out a malignancy.  Discharged in good condition.  Understands return precautions.  This chart was dictated using voice recognition software.  Despite best efforts to proofread,  errors can occur which can change the documentation meaning.         Final Clinical Impression(s) / ED Diagnoses Final diagnoses:  Community acquired pneumonia, unspecified laterality     Rx / DC Orders ED Discharge Orders          Ordered    doxycycline (VIBRAMYCIN) 100 MG capsule  2 times daily        01/04/22 1454    predniSONE (DELTASONE) 20 MG tablet  Daily        01/04/22 1454              Lennice Sites, DO 01/04/22 1532

## 2022-01-04 NOTE — ED Notes (Signed)
Appears in no resp distress. No DOE noted. Speech WNL

## 2022-01-04 NOTE — ED Notes (Signed)
States she has had a cough, prod cough, thin clear sec, for the past 3 weeks, Denies any fevers at this time. States has cough at all times and unable to sleep. Was seen by PCP and at [place] for this same complaint. Here for reevaluation due to cont cough

## 2022-01-04 NOTE — ED Triage Notes (Signed)
States has had a chronic cough for several weeks, has been seen here and at PCP. Taken amoxicillin

## 2022-01-04 NOTE — Discharge Instructions (Signed)
Take your next dose of steroid and antibiotic tomorrow.  Recommend doing 2 puffs of albuterol inhaler every 4 hours as needed.  Follow-up with your primary care doctor for repeat x-ray and reevaluation in about a week.  Please return if symptoms worsen.

## 2022-01-11 ENCOUNTER — Telehealth: Payer: Self-pay

## 2022-01-11 NOTE — Telephone Encounter (Signed)
Contacted patient on preferred number listed in notes for scheduled AWV. Patient stated unable to complete visit today, okay to reschedule.

## 2022-01-12 ENCOUNTER — Ambulatory Visit (INDEPENDENT_AMBULATORY_CARE_PROVIDER_SITE_OTHER): Payer: Medicare Other | Admitting: Family Medicine

## 2022-01-12 ENCOUNTER — Encounter: Payer: Self-pay | Admitting: Family Medicine

## 2022-01-12 VITALS — BP 124/80 | HR 79 | Temp 98.5°F | Ht 66.0 in | Wt 176.2 lb

## 2022-01-12 DIAGNOSIS — J189 Pneumonia, unspecified organism: Secondary | ICD-10-CM

## 2022-01-12 DIAGNOSIS — R051 Acute cough: Secondary | ICD-10-CM | POA: Diagnosis not present

## 2022-01-12 NOTE — Progress Notes (Signed)
Subjective:    Patient ID: Merlene Morse, female    DOB: [birth year], 71 y.o.   MRN: [id number]  Chief Complaint  Patient presents with   Follow-up    HPI Patient is a 71 yo female non-smoker with pmh sig for allergies, osteopenia, obesity who is followed by Dr. Martinique, seen today for ED f/u.  Pt seen in ED 01/04/22 for CAP, given rx for prednisone and doxy as CXR  01/04/22 with R mid basilar airspace opacity suspicious for pna.  Patient notes improvement in symptoms.  Still having intermittent productive cough.  States energy is improving.  Not having to use albuterol inhaler as much.  Endorses feeling shaky while using albuterol inhaler.  Doxycycline course almost completed. One day of prednisone left.  Pt denies fever, chills, nausea, vomiting, changes in appetite.   Past Medical History:  Diagnosis Date   BACK PAIN, UPPER 02/18/2009   Cough     Allergies  Allergen Reactions   Hydrocodone Nausea And Vomiting    ROS General: Denies fever, chills, night sweats, changes in weight, changes in appetite HEENT: Denies headaches, ear pain, changes in vision, rhinorrhea, sore throat CV: Denies CP, palpitations, SOB, orthopnea Pulm: Denies SOB, cough, wheezing  +cough, wheezing GI: Denies abdominal pain, nausea, vomiting, diarrhea, constipation GU: Denies dysuria, hematuria, frequency, vaginal discharge Msk: Denies muscle cramps, joint pains Neuro: Denies weakness, numbness, tingling Skin: Denies rashes, bruising Psych: Denies depression, anxiety, hallucinations      Objective:    Blood pressure 124/80, pulse 79, temperature 98.5 F (36.9 C), temperature source Oral, height '5\' 6"'$  (1.676 m), weight 176 lb 4 oz (79.9 kg), SpO2 97 %.  Gen. Pleasant, well-nourished, in no distress, normal affect   HEENT: /AT, face symmetric, conjunctiva clear, no scleral icterus, PERRLA, EOMI, nares patent without drainage Lungs: no accessory muscle use, scattered wheezes b/l with slightly decreased  breath sounds posteriorly, L>R. Cardiovascular: RRR, no m/r/g, no peripheral edema Neuro:  A&Ox3, CN II-XII intact, normal gait Skin:  Warm, no lesions/ rash   Wt Readings from Last 3 Encounters:  01/12/22 176 lb 4 oz (79.9 kg)  01/04/22 174 lb (78.9 kg)  12/24/21 176 lb 6.4 oz (80 kg)    Lab Results  Component Value Date   WBC 4.7 02/07/2018   HGB 12.6 02/07/2018   HCT 37.8 02/07/2018   PLT 246.0 02/07/2018   GLUCOSE 113 (H) 04/23/2019   ALT 20 02/03/2013   AST 18 02/03/2013   NA 141 04/23/2019   K 4.4 04/23/2019   CL 106 04/23/2019   CREATININE 0.70 04/23/2019   BUN 12 04/23/2019   CO2 28 04/23/2019   HGBA1C 5.9 12/24/2019    Assessment/Plan:  Community acquired pneumonia of right middle lobe of lung  -Improving -CXR 01/04/2022 :Right mid basilar air opacity, suspicious for pneumonia.  Follow-up PA and lateral COVID are recommended in 3-4 weeks following trial of antibiotic therapy to ensure resolution and exclude underlying malignancy. -s/p prednisone 20 mg daily x5 days -Complete course of doxycycline today, day 10/10. -Will have patient return to clinic 01/26/2022 for repeat CXR -Advised okay to continue albuterol inhaler as needed for wheezing, p.o. antihistamine/Flonase if needed -Given strict strict precautions - Plan: DG Chest 2 View  Acute cough  -2/2 CAP -Status post prednisone -Complete doxycycline course -Repeat CXR for resolution of PNA on or after 01/26/2022 - Plan: DG Chest 2 View  F/u as needed for continued or worsening symptoms.  Repeat CXR on after 01/26/2022  [facility]  Volanda Napoleon, MD

## 2022-01-12 NOTE — Patient Instructions (Signed)
The order for your repeat chest x-ray was placed.  You can schedule a lab/x-ray appointment to have this done in clinic, after 01/26/2022.

## 2022-01-13 ENCOUNTER — Telehealth: Payer: Self-pay | Admitting: Family Medicine

## 2022-01-13 NOTE — Telephone Encounter (Signed)
Left message for patient to call back and schedule Medicare Annual Wellness Visit (AWV) either virtually or in office. Left  my Herbie Drape number [phone]   Last AWV 01/09/21 ; please schedule at anytime with [facility] Nurse Health Advisor 1 or 2   .

## 2022-01-21 ENCOUNTER — Ambulatory Visit: Payer: Medicare Other | Admitting: *Deleted

## 2022-01-21 NOTE — Progress Notes (Signed)
Erroneous visit

## 2022-01-21 NOTE — Patient Instructions (Signed)

## 2022-01-26 ENCOUNTER — Other Ambulatory Visit: Payer: Medicare Other

## 2022-01-26 ENCOUNTER — Ambulatory Visit (INDEPENDENT_AMBULATORY_CARE_PROVIDER_SITE_OTHER): Payer: Medicare Other

## 2022-01-26 DIAGNOSIS — J189 Pneumonia, unspecified organism: Secondary | ICD-10-CM

## 2022-01-26 DIAGNOSIS — R051 Acute cough: Secondary | ICD-10-CM | POA: Diagnosis not present

## 2022-01-26 DIAGNOSIS — R918 Other nonspecific abnormal finding of lung field: Secondary | ICD-10-CM | POA: Diagnosis not present

## 2022-01-28 ENCOUNTER — Telehealth: Payer: Self-pay | Admitting: Family Medicine

## 2022-01-28 NOTE — Telephone Encounter (Signed)
Pt called to FU on plan of care, following her Hospital FU after ED visit. Pt stated she was supposed to get a call back from MD to let her know what MD wanted to do for her, moving forward.  Please advise.

## 2022-01-28 NOTE — Telephone Encounter (Signed)
Set up follow up with PCP please, she saw Dr. Volanda Napoleon back on 9/13 and she recommended following with Dr. Martinique in 2 weeks. Thank you!

## 2022-01-28 NOTE — Telephone Encounter (Signed)
Called Pt to schedule an appointment with Dr. Martinique.  No answer, left message for Pt to call back to schedule at her earliest convenience.

## 2022-01-31 ENCOUNTER — Ambulatory Visit (INDEPENDENT_AMBULATORY_CARE_PROVIDER_SITE_OTHER): Payer: Medicare Other

## 2022-01-31 VITALS — Ht 66.0 in | Wt 173.0 lb

## 2022-01-31 DIAGNOSIS — Z1231 Encounter for screening mammogram for malignant neoplasm of breast: Secondary | ICD-10-CM

## 2022-01-31 DIAGNOSIS — Z0000 Encounter for general adult medical examination without abnormal findings: Secondary | ICD-10-CM

## 2022-01-31 NOTE — Progress Notes (Signed)
I connected with Kathryn Phillips today by telephone and verified that I am speaking with the correct person using two identifiers. Location patient: home Location provider: work Persons participating in the virtual visit: Jaley, Yan LPN.   I discussed the limitations, risks, security and privacy concerns of performing an evaluation and management service by telephone and the availability of in person appointments. I also discussed with the patient that there may be a patient responsible charge related to this service. The patient expressed understanding and verbally consented to this telephonic visit.    Interactive audio and video telecommunications were attempted between this provider and patient, however failed, due to patient having technical difficulties OR patient did not have access to video capability.  We continued and completed visit with audio only.     Vital signs may be patient reported or missing.  Subjective:   Kathryn Phillips is a 71 y.o. female who presents for Medicare Annual (Subsequent) preventive examination.  Review of Systems     Cardiac Risk Factors include: advanced age (>52mn, >>74women)     Objective:    Today's Vitals   01/31/22 1357  Weight: 173 lb (78.5 kg)  Height: '5\' 6"'$  (1.676 m)   Body mass index is 27.92 kg/m.     01/31/2022    2:00 PM 01/04/2022   11:59 AM 12/16/2021    5:41 PM 04/27/2020    7:48 PM 06/09/2019    6:56 PM 05/08/2019    1:28 PM 09/12/2013    9:19 AM  Advanced Directives  Does Patient Have a Medical Advance Directive? No No No No No No Patient does not have advance directive  Would patient like information on creating a medical advance directive?  No - Patient declined No - Patient declined  Yes (ED - Information included in AVS) No - Patient declined   Pre-existing out of facility DNR order (yellow form or pink MOST form)       No    Current Medications (verified) Outpatient Encounter Medications as of 01/31/2022   Medication Sig   acetaminophen (TYLENOL) 100 MG/ML solution Take 10 mg/kg by mouth every 4 (four) hours as needed. Patient used this medication for pain.   albuterol (VENTOLIN HFA) 108 (90 Base) MCG/ACT inhaler Inhale 2 puffs into the lungs every 6 (six) hours as needed for wheezing or shortness of breath.   fluticasone (FLONASE) 50 MCG/ACT nasal spray Place 1 spray into both nostrils daily.   Multiple Vitamin (MULTIVITAMIN) tablet Take 1 tablet by mouth daily.   SYMBICORT 160-4.5 MCG/ACT inhaler INHALE 2 PUFFS INTO THE LUNGS TWICE A DAY   No facility-administered encounter medications on file as of 01/31/2022.    Allergies (verified) Hydrocodone   History: Past Medical History:  Diagnosis Date   BACK PAIN, UPPER 02/18/2009   Cough    Past Surgical History:  Procedure Laterality Date   CESAREAN SECTION     myonectomy     NASAL SINUS SURGERY     SHOULDER ARTHROSCOPY W/ ROTATOR CUFF REPAIR     Family History  Problem Relation Age of Onset   Diabetes Father    Colon cancer Neg Hx    Pancreatic cancer Neg Hx    Rectal cancer Neg Hx    Stomach cancer Neg Hx    Social History   Socioeconomic History   Marital status: Married    Spouse name: Not on file   Number of children: 1   Years of education: 134  Highest education level: Associate degree: occupational, Hotel manager, or vocational program  Occupational History   Occupation: self employed    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  Tobacco Use   Smoking status: Never   Smokeless tobacco: Never  Vaping Use   Vaping Use: Never used  Substance and Sexual Activity   Alcohol use: Not Currently   Drug use: No   Sexual activity: Not on file  Other Topics Concern   Not on file  Social History Narrative   Married   1 son   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   Social Determinants of Health   Financial Resource Strain: Low Risk  (01/31/2022)   Overall Financial Resource Strain (CARDIA)     Difficulty of Paying Living Expenses: Not hard at all  Food Insecurity: No Food Insecurity (01/31/2022)   Hunger Vital Sign    Worried About Running Out of Food in the Last Year: Never true    [place] in the Last Year: Never true  Transportation Needs: No Transportation Needs (01/31/2022)   PRAPARE - Hydrologist (Medical): No    Lack of Transportation (Non-Medical): No  Physical Activity: Inactive (01/31/2022)   Exercise Vital Sign    Days of Exercise per Week: 0 days    Minutes of Exercise per Session: 0 min  Stress: No Stress Concern Present (01/31/2022)   [place]    Feeling of Stress : Not at all  Social Connections: [place] (01/09/2021)   Social Connection and Isolation Panel [NHANES]    Frequency of Communication with Friends and Family: More than three times a week    Frequency of Social Gatherings with Friends and Family: Three times a week    Attends Religious Services: More than 4 times per year    Active Member of Clubs or Organizations: Yes    Attends Music therapist: More than 4 times per year    Marital Status: Married    Tobacco Counseling Counseling given: Not Answered   Clinical Intake:  Pre-visit preparation completed: Yes  Pain : No/denies pain     Nutritional Status: BMI 25 -29 Overweight Nutritional Risks: None Diabetes: No  How often do you need to have someone help you when you read instructions, pamphlets, or other written materials from your doctor or pharmacy?: 1 - Never What is the last grade level you completed in school?: business school  Diabetic? no  Interpreter Needed?: No  Information entered by :: NAllen LPN   Activities of Daily Living    01/31/2022    2:02 PM  In your present state of health, do you have any difficulty performing the following activities:  Hearing? 0  Vision? 0  Difficulty  concentrating or making decisions? 0  Walking or climbing stairs? 0  Dressing or bathing? 0  Doing errands, shopping? 0  Preparing Food and eating ? N  Using the Toilet? N  In the past six months, have you accidently leaked urine? N  Do you have problems with loss of bowel control? N  Managing your Medications? N    Patient Care Team: [place], Betty G, MD as PCP - General (Family Medicine)  Indicate any recent Medical Services you may have received from other than [facility] providers in the past year (date may be approximate).     Assessment:   This is a routine wellness examination for Kathryn Phillips.  Hearing/Vision screen Vision Screening - Comments:: No regular eye exams, [facility]  Dietary issues and exercise activities discussed: Current Exercise Habits: The patient does not participate in regular exercise at present   Goals Addressed             This Visit's Progress    Patient Stated       02/18/2022, wants to lose weight       Depression Screen    01/31/2022    2:02 PM 01/12/2022    1:04 PM 12/24/2021    1:26 PM 01/09/2021   11:06 AM 05/08/2019    1:30 PM 04/23/2019   12:27 PM  PHQ 2/9 Scores  PHQ - 2 Score 0 1 0 0 0 0  PHQ- 9 Score  7 4       Fall Risk    01/31/2022    2:01 PM 01/12/2022    1:04 PM 12/24/2021    1:26 PM 01/09/2021   11:07 AM 05/08/2019    1:30 PM  Fall Risk   Falls in the past year? '1 1 1 '$ 0 0  Comment tripped over dog      Number falls in past yr: 0 0 0    Injury with Fall? 0 0 0    Risk for fall due to : Medication side effect Other (Comment) Other (Comment)  No Fall Risks  Follow up Falls evaluation completed;Education provided;Falls prevention discussed Falls evaluation completed Falls evaluation completed Falls evaluation completed Falls evaluation completed;Education provided;Falls prevention discussed    FALL RISK PREVENTION PERTAINING TO THE HOME:  Any stairs in or around the home? Yes  If so, are there any without handrails? No  Home  free of loose throw rugs in walkways, pet beds, electrical cords, etc? Yes  Adequate lighting in your home to reduce risk of falls? Yes   ASSISTIVE DEVICES UTILIZED TO PREVENT FALLS:  Life alert? No  Use of a cane, walker or w/c? No  Grab bars in the bathroom? Yes  Shower chair or bench in shower? Yes  Elevated toilet seat or a handicapped toilet? Yes   TIMED UP AND GO:  Was the test performed? No .      Cognitive Function:        01/31/2022    2:03 PM 01/09/2021   11:07 AM 05/08/2019    1:33 PM  6CIT Screen  What Year? 0 points 0 points 0 points  What month? 0 points 0 points 0 points  What time? 0 points 0 points 0 points  Count back from 20 0 points 0 points 0 points  Months in reverse 0 points 0 points 0 points  Repeat phrase 0 points 0 points 0 points  Total Score 0 points 0 points 0 points    Immunizations Immunization History  Administered Date(s) Administered   Fluad Quad(high Dose 65+) 04/23/2019   Moderna Sars-Covid-2 Vaccination 05/26/2019, 06/23/2019   Pneumococcal Polysaccharide-23 04/23/2019   Td 05/02/1994    TDAP status: Due, Education has been provided regarding the importance of this vaccine. Advised may receive this vaccine at local pharmacy or Health Dept. Aware to provide a copy of the vaccination record if obtained from local pharmacy or Health Dept. Verbalized acceptance and understanding.  Flu Vaccine status: Due, Education has been provided regarding the importance of this vaccine. Advised may receive this vaccine at local pharmacy or Health Dept. Aware to provide a copy of the vaccination record if obtained from local pharmacy or Health Dept. Verbalized  acceptance and understanding.  Pneumococcal vaccine status: Due, Education has been provided regarding the importance of this vaccine. Advised may receive this vaccine at local pharmacy or Health Dept. Aware to provide a copy of the vaccination record if obtained from local pharmacy or Health  Dept. Verbalized acceptance and understanding.  Covid-19 vaccine status: Completed vaccines  Qualifies for Shingles Vaccine? Yes   Zostavax completed No   Shingrix Completed?: No.    Education has been provided regarding the importance of this vaccine. Patient has been advised to call insurance company to determine out of pocket expense if they have not yet received this vaccine. Advised may also receive vaccine at local pharmacy or Health Dept. Verbalized acceptance and understanding.  Screening Tests Health Maintenance  Topic Date Due   Zoster Vaccines- Shingrix (1 of 2) Never done   TETANUS/TDAP  05/02/2004   COVID-19 Vaccine (3 - Moderna series) 08/18/2019   Pneumonia Vaccine 26+ Years old (2 - PCV) 04/22/2020   MAMMOGRAM  11/20/2021   INFLUENZA VACCINE  11/30/2021   COLONOSCOPY (Pts 45-93yr Insurance coverage will need to be confirmed)  09/26/2023   DEXA SCAN  Completed   Hepatitis C Screening  Completed   HPV VACCINES  Aged Out    Health Maintenance  Health Maintenance Due  Topic Date Due   Zoster Vaccines- Shingrix (1 of 2) Never done   TETANUS/TDAP  05/02/2004   COVID-19 Vaccine (3 - Moderna series) 08/18/2019   Pneumonia Vaccine 69 Years old (2 - PCV) 04/22/2020   MAMMOGRAM  11/20/2021   INFLUENZA VACCINE  11/30/2021    Colorectal cancer screening: Type of screening: Colonoscopy. Completed 09/25/2013. Repeat every 10 years  Mammogram status: Ordered today. Pt provided with contact info and advised to call to schedule appt.   Bone Density status: Completed 05/08/2019.   Lung Cancer Screening: (Low Dose CT Chest recommended if Age 69-80years, 30 pack-year currently smoking OR have quit w/in 15years.) does not qualify.   Lung Cancer Screening Referral: no  Additional Screening:  Hepatitis C Screening: does qualify; Completed 04/23/2019  Vision Screening: Recommended annual ophthalmology exams for early detection of glaucoma and other disorders of the eye. Is  the patient up to date with their annual eye exam?  No  Who is the provider or what is the name of the [facility] which the patient attends [facility] provider, would they like to be referred to a provider to establish care? No .   Dental Screening: Recommended annual dental exams for proper oral hygiene  Community Resource Referral / Chronic Care Management: CRR required this visit?  No   CCM required this visit?  No      Plan:     I have personally reviewed and noted the following in the patient's chart:   Medical and social history Use of alcohol, tobacco or illicit drugs  Current medications and supplements including opioid prescriptions. Patient is not currently taking opioid prescriptions. Functional ability and status Nutritional status Physical activity Advanced directives List of other physicians Hospitalizations, surgeries, and ER visits in previous 12 months Vitals Screenings to include cognitive, depression, and falls Referrals and appointments  In addition, I have reviewed and discussed with patient certain preventive protocols, quality metrics, and best practice recommendations. A written personalized care plan for preventive services as well as general preventive health recommendations were provided to patient.     NKellie Simmering LPN   [id number]  Nurse Notes: none  Due to this being a virtual visit, the after visit summary with patients personalized plan was offered to patient via mail or my-chart.  Patient would like to access on my-chart

## 2022-01-31 NOTE — Telephone Encounter (Signed)
Called Pt back to schedule.  Pt asked if MD could please send her something to treat her cough, as soon as possible, as the cough is not letting her sleep at all.  Mucus is very thick.   Pt was prescribed prednisone and albuterol at [facility].  Pt has been scheduled for an OV on Tuesday, 02/01/22.  [id number] [address] [place]  [phone] Fax:  [phone]

## 2022-01-31 NOTE — Patient Instructions (Signed)
Kathryn Phillips , Thank you for taking time to come for your Medicare Wellness Visit. I appreciate your ongoing commitment to your health goals. Please review the following plan we discussed and let me know if I can assist you in the future.   Screening recommendations/referrals: Colonoscopy: completed 09/25/2013, due 09/26/2023 Mammogram: ordered today Bone Density: completed 05/08/2019 Recommended yearly ophthalmology/optometry visit for glaucoma screening and checkup Recommended yearly dental visit for hygiene and checkup  Vaccinations: Influenza vaccine: due Pneumococcal vaccine: due Tdap vaccine: due Shingles vaccine: discussed   Covid-19: 06/23/2019, 05/26/2019  Advanced directives: Advance directive discussed with you today.   Conditions/risks identified: none  Next appointment: Follow up in one year for your annual wellness visit    Preventive Care 65 Years and Older, Female Preventive care refers to lifestyle choices and visits with your health care provider that can promote health and wellness. What does preventive care include? A yearly physical exam. This is also called an annual well check. Dental exams once or twice a year. Routine eye exams. Ask your health care provider how often you should have your eyes checked. Personal lifestyle choices, including: Daily care of your teeth and gums. Regular physical activity. Eating a healthy diet. Avoiding tobacco and drug use. Limiting alcohol use. Practicing safe sex. Taking low-dose aspirin every day. Taking vitamin and mineral supplements as recommended by your health care provider. What happens during an annual well check? The services and screenings done by your health care provider during your annual well check will depend on your age, overall health, lifestyle risk factors, and family history of disease. Counseling  Your health care provider may ask you questions about your: Alcohol use. Tobacco use. Drug use. Emotional  well-being. Home and relationship well-being. Sexual activity. Eating habits. History of falls. Memory and ability to understand (cognition). Work and work Statistician. Reproductive health. Screening  You may have the following tests or measurements: Height, weight, and BMI. Blood pressure. Lipid and cholesterol levels. These may be checked every 5 years, or more frequently if you are over 1 years old. Skin check. Lung cancer screening. You may have this screening every year starting at age 68 if you have a 30-pack-year history of smoking and currently smoke or have quit within the past 15 years. Fecal occult blood test (FOBT) of the stool. You may have this test every year starting at age 26. Flexible sigmoidoscopy or colonoscopy. You may have a sigmoidoscopy every 5 years or a colonoscopy every 10 years starting at age 64. Hepatitis C blood test. Hepatitis B blood test. Sexually transmitted disease (STD) testing. Diabetes screening. This is done by checking your blood sugar (glucose) after you have not eaten for a while (fasting). You may have this done every 1-3 years. Bone density scan. This is done to screen for osteoporosis. You may have this done starting at age 2. Mammogram. This may be done every 1-2 years. Talk to your health care provider about how often you should have regular mammograms. Talk with your health care provider about your test results, treatment options, and if necessary, the need for more tests. Vaccines  Your health care provider may recommend certain vaccines, such as: Influenza vaccine. This is recommended every year. Tetanus, diphtheria, and acellular pertussis (Tdap, Td) vaccine. You may need a Td booster every 10 years. Zoster vaccine. You may need this after age 70. Pneumococcal 13-valent conjugate (PCV13) vaccine. One dose is recommended after age 24. Pneumococcal polysaccharide (PPSV23) vaccine. One dose is recommended after age  64. Talk to your  health care provider about which screenings and vaccines you need and how often you need them. This information is not intended to replace advice given to you by your health care provider. Make sure you discuss any questions you have with your health care provider. Document Released: 05/15/2015 Document Revised: 01/06/2016 Document Reviewed: 02/17/2015 Elsevier Interactive Patient Education  2017 [place] Prevention in the Home Falls can cause injuries. They can happen to people of all ages. There are many things you can do to make your home safe and to help prevent falls. What can I do on the outside of my home? Regularly fix the edges of walkways and driveways and fix any cracks. Remove anything that might make you trip as you walk through a door, such as a raised step or threshold. Trim any bushes or trees on the path to your home. Use bright outdoor lighting. Clear any walking paths of anything that might make someone trip, such as rocks or tools. Regularly check to see if handrails are loose or broken. Make sure that both sides of any steps have handrails. Any raised decks and porches should have guardrails on the edges. Have any leaves, snow, or ice cleared regularly. Use sand or salt on walking paths during winter. Clean up any spills in your garage right away. This includes oil or grease spills. What can I do in the bathroom? Use night lights. Install grab bars by the toilet and in the tub and shower. Do not use towel bars as grab bars. Use non-skid mats or decals in the tub or shower. If you need to sit down in the shower, use a plastic, non-slip stool. Keep the floor dry. Clean up any water that spills on the floor as soon as it happens. Remove soap buildup in the tub or shower regularly. Attach bath mats securely with double-sided non-slip rug tape. Do not have throw rugs and other things on the floor that can make you trip. What can I do in the bedroom? Use night  lights. Make sure that you have a light by your bed that is easy to reach. Do not use any sheets or blankets that are too big for your bed. They should not hang down onto the floor. Have a firm chair that has side arms. You can use this for support while you get dressed. Do not have throw rugs and other things on the floor that can make you trip. What can I do in the kitchen? Clean up any spills right away. Avoid walking on wet floors. Keep items that you use a lot in easy-to-reach places. If you need to reach something above you, use a strong step stool that has a grab bar. Keep electrical cords out of the way. Do not use floor polish or wax that makes floors slippery. If you must use wax, use non-skid floor wax. Do not have throw rugs and other things on the floor that can make you trip. What can I do with my stairs? Do not leave any items on the stairs. Make sure that there are handrails on both sides of the stairs and use them. Fix handrails that are broken or loose. Make sure that handrails are as long as the stairways. Check any carpeting to make sure that it is firmly attached to the stairs. Fix any carpet that is loose or worn. Avoid having throw rugs at the top or bottom of the stairs. If you do have  throw rugs, attach them to the floor with carpet tape. Make sure that you have a light switch at the top of the stairs and the bottom of the stairs. If you do not have them, ask someone to add them for you. What else can I do to help prevent falls? Wear shoes that: Do not have high heels. Have rubber bottoms. Are comfortable and fit you well. Are closed at the toe. Do not wear sandals. If you use a stepladder: Make sure that it is fully opened. Do not climb a closed stepladder. Make sure that both sides of the stepladder are locked into place. Ask someone to hold it for you, if possible. Clearly mark and make sure that you can see: Any grab bars or handrails. First and last  steps. Where the edge of each step is. Use tools that help you move around (mobility aids) if they are needed. These include: Canes. Walkers. Scooters. Crutches. Turn on the lights when you go into a dark area. Replace any light bulbs as soon as they burn out. Set up your furniture so you have a clear path. Avoid moving your furniture around. If any of your floors are uneven, fix them. If there are any pets around you, be aware of where they are. Review your medicines with your doctor. Some medicines can make you feel dizzy. This can increase your chance of falling. Ask your doctor what other things that you can do to help prevent falls. This information is not intended to replace advice given to you by your health care provider. Make sure you discuss any questions you have with your health care provider. Document Released: 02/12/2009 Document Revised: 09/24/2015 Document Reviewed: 05/23/2014 Elsevier Interactive Patient Education  2017 [employer].

## 2022-01-31 NOTE — Progress Notes (Unsigned)
HPI: Kathryn Phillips is a 71 y.o. female, who is here today to follow on recent ED visit. I saw her last on 06/10/20. She was evaluated in the ED on 01/04/22 and Dx'ed with CAP. CXR Right mid basilar airspace opacity, suspicious for pneumonia. She completed abx treatment, Doxycycline and Prednisone. She had Amoxicillin in 11/2021 for URI.  Follow up in the office on 01/12/22. CXR repeated on 01/27/22:Previously noted patchy opacity of the right lung base is improved but not completely resolved. States that she has had cough intermittently for months now, in 06/2020 she reported having cough and wheezing since 05/2021, not getting better, Azithromycin and Symbicort were recommended. She is using Albuterol inh as needed.  Acid reflux and heartburn when symptoms first started, not at this time. Productive cough and wheezing, clear sputum,worse at night when in bed and also exacerbated by exertion. No SOB,orthopnea,or PND. Clear rhinorrhea,nasal congestion,and post nasal drainage.  In general symptoms have improved. No hx of tobacco use or asthma. + Allergies.  Prediabetes: Negative for polydipsia,polyuria, or polyphagia.  Lab Results  Component Value Date   HGBA1C 5.9 12/24/2019   In 04/2019 HgA1C was 6.4.  Review of Systems  Constitutional:  Positive for fatigue. Negative for activity change, appetite change and fever.  HENT:  Negative for ear pain, mouth sores, sinus pain and sore throat.   Eyes:  Negative for discharge, redness and itching.  Gastrointestinal:  Negative for abdominal pain, nausea and vomiting.  Genitourinary:  Negative for decreased urine volume, dysuria and hematuria.  Musculoskeletal:  Negative for gait problem and myalgias.  Skin:  Negative for rash.  Allergic/Immunologic: Positive for environmental allergies.  Neurological:  Negative for syncope, weakness and headaches.  Rest see pertinent positives and negatives per HPI.  Current Outpatient Medications on  File Prior to Visit  Medication Sig Dispense Refill   acetaminophen (TYLENOL) 100 MG/ML solution Take 10 mg/kg by mouth every 4 (four) hours as needed. Patient used this medication for pain.     albuterol (VENTOLIN HFA) 108 (90 Base) MCG/ACT inhaler Inhale 2 puffs into the lungs every 6 (six) hours as needed for wheezing or shortness of breath.     fluticasone (FLONASE) 50 MCG/ACT nasal spray Place 1 spray into both nostrils daily. 16 g 1   Multiple Vitamin (MULTIVITAMIN) tablet Take 1 tablet by mouth daily.     No current facility-administered medications on file prior to visit.    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  Tobacco Use   Smoking status: Never   Smokeless tobacco: Never  Vaping Use   Vaping Use: Never used  Substance and Sexual Activity   Alcohol use: Not Currently   Drug use: No   Sexual activity: Not on file  Other Topics Concern   Not on file  Social History Narrative   Married   1 son   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   Social Determinants of Health   Financial Resource Strain: Low Risk  (  01/31/2022)   Overall Financial Resource Strain (CARDIA)    Difficulty of Paying Living Expenses: Not hard at all  Food Insecurity: No Food Insecurity (01/31/2022)   Hunger Vital Sign    Worried About Running Out of Food in the Last Year: Never true    Ran Out of Food in the Last Year: Never true  Transportation Needs: No Transportation Needs (01/31/2022)   PRAPARE - Hydrologist  (Medical): No    Lack of Transportation (Non-Medical): No  Physical Activity: Inactive (01/31/2022)   Exercise Vital Sign    Days of Exercise per Week: 0 days    Minutes of Exercise per Session: 0 min  Stress: No Stress Concern Present (01/31/2022)   [place]    Feeling of Stress : Not at all  Social Connections: [place] (01/09/2021)   Social Connection and Isolation Panel [NHANES]    Frequency of Communication with Friends and Family: More than three times a week    Frequency of Social Gatherings with Friends and Family: Three times a week    Attends Religious Services: More than 4 times per year    Active Member of Clubs or Organizations: Yes    Attends Archivist Meetings: More than 4 times per year    Marital Status: Married   Vitals:   02/01/22 1130  BP: 126/80  Pulse: 77  Resp: 16  Temp: 98.6 F (37 C)  SpO2: 97%  Body mass index is 28.61 kg/m.  Physical Exam Vitals and nursing note reviewed.  Constitutional:      General: She is not in acute distress.    Appearance: She is well-developed.  HENT:     Head: Normocephalic and atraumatic.     Nose: Rhinorrhea present.     Right Turbinates: Enlarged.     Left Turbinates: Enlarged.     Comments: Postnasal drainage.    Mouth/Throat:     Mouth: Mucous membranes are moist.     Pharynx: Oropharynx is clear.  Eyes:     Conjunctiva/sclera: Conjunctivae normal.  Cardiovascular:     Rate and Rhythm: Normal rate and regular rhythm.     Heart sounds: No murmur heard. Pulmonary:     Effort: Pulmonary effort is normal. No respiratory distress.     Breath sounds: No stridor. Wheezing and rhonchi present. No rales.  Abdominal:     Palpations: Abdomen is soft. There is no hepatomegaly or mass.     Tenderness: There is no abdominal tenderness.  Lymphadenopathy:     Cervical: No cervical adenopathy.  Skin:    General: Skin is warm.      Findings: No erythema or rash.  Neurological:     General: No focal deficit present.     Mental Status: She is alert and oriented to person, place, and time.     Cranial Nerves: No cranial nerve deficit.     Gait: Gait normal.  Psychiatric:        Mood and Affect: Mood and affect normal.   ASSESSMENT AND PLAN:  [name] was seen today for follow-up.  Diagnoses and all orders for this visit: Orders Placed This Encounter  Procedures   CT Chest Wo Contrast   CBC   Basic metabolic panel   Hemoglobin A1c   Lab Results  Component Value Date   HGBA1C 6.5 02/01/2022   Lab Results  Component Value Date   WBC 4.3 02/01/2022  HGB 12.8 02/01/2022   HCT 38.1 02/01/2022   MCV 85.1 02/01/2022   PLT 201.0 02/01/2022   Lab Results  Component Value Date   CREATININE 0.73 02/01/2022   BUN 11 02/01/2022   NA 140 02/01/2022   K 4.1 02/01/2022   CL 106 02/01/2022   CO2 29 02/01/2022   Persistent cough for 3 weeks or longer She has had intermittent cough and wheezing since 05/2021. She is reporting some improvement, last CXR still with persistent patchy opacity of the right lung base, although improved I think a chest CT is appropriate at this time.  Reactive airway disease without asthma Asthma , COPD? Symbicort helped in 05/2021, so recommend resuming it.  Side effects discussed. Continue Albuterol inh 2 puff q 4-6 hours as needed.  -     budesonide-formoterol (SYMBICORT) 160-4.5 MCG/ACT inhaler; INHALE 2 PUFFS INTO THE LUNGS TWICE A DAY  Allergic rhinitis, unspecified seasonality, unspecified trigger Continue Flonase nasal spray daily as needed. Nasal saline irrigations as needed.  Gastroesophageal reflux disease, unspecified whether esophagitis present This problem could be a contributing factor for persistent cough. Recommend trial of PPI for 6-8 weeks. GERD precautions.  -     omeprazole (PRILOSEC) 40 MG capsule; Take 1 capsule (40 mg total) by mouth  daily.  Prediabetes Continue a healthy life style for diabetes prevention. Further recommendations according to HgA1C result.  Return in about 6 weeks (around 03/15/2022).  Jace Dowe G. Martinique, MD  [facility]. [place] office.

## 2022-02-01 ENCOUNTER — Ambulatory Visit (INDEPENDENT_AMBULATORY_CARE_PROVIDER_SITE_OTHER): Payer: Medicare Other | Admitting: Family Medicine

## 2022-02-01 ENCOUNTER — Encounter: Payer: Self-pay | Admitting: Family Medicine

## 2022-02-01 VITALS — BP 126/80 | HR 77 | Temp 98.6°F | Resp 16 | Ht 66.0 in | Wt 177.2 lb

## 2022-02-01 DIAGNOSIS — R7303 Prediabetes: Secondary | ICD-10-CM

## 2022-02-01 DIAGNOSIS — J989 Respiratory disorder, unspecified: Secondary | ICD-10-CM

## 2022-02-01 DIAGNOSIS — K219 Gastro-esophageal reflux disease without esophagitis: Secondary | ICD-10-CM

## 2022-02-01 DIAGNOSIS — J309 Allergic rhinitis, unspecified: Secondary | ICD-10-CM | POA: Diagnosis not present

## 2022-02-01 DIAGNOSIS — R053 Chronic cough: Secondary | ICD-10-CM | POA: Diagnosis not present

## 2022-02-01 LAB — BASIC METABOLIC PANEL
BUN: 11 mg/dL (ref 6–23)
CO2: 29 mEq/L (ref 19–32)
Calcium: 9.2 mg/dL (ref 8.4–10.5)
Chloride: 106 mEq/L (ref 96–112)
Creatinine, Ser: 0.73 mg/dL (ref 0.40–1.20)
GFR: 83.96 mL/min (ref >60.00)
Glucose, Bld: 101 mg/dL — ABNORMAL HIGH (ref 70–99)
Potassium: 4.1 mEq/L (ref 3.5–5.1)
Sodium: 140 mEq/L (ref 135–145)

## 2022-02-01 LAB — CBC
HCT: 38.1 % (ref 36.0–46.0)
Hemoglobin: 12.8 g/dL (ref 12.0–15.0)
MCHC: 33.7 g/dL (ref 30.0–36.0)
MCV: 85.1 fl (ref 78.0–100.0)
Platelets: 201 10*3/uL (ref 150.0–400.0)
RBC: 4.47 Mil/uL (ref 3.87–5.11)
RDW: 14.5 % (ref 11.5–15.5)
WBC: 4.3 10*3/uL (ref 4.0–10.5)

## 2022-02-01 LAB — HEMOGLOBIN A1C: Hgb A1c MFr Bld: 6.5 % (ref 4.6–6.5)

## 2022-02-01 MED ORDER — BUDESONIDE-FORMOTEROL FUMARATE 160-4.5 MCG/ACT IN AERO: INHALATION_SPRAY | RESPIRATORY_TRACT | 3 refills | Status: DC

## 2022-02-01 MED ORDER — OMEPRAZOLE 40 MG PO CPDR: 40.0000 mg | DELAYED_RELEASE_CAPSULE | Freq: Every day | ORAL | 1 refills | Status: DC

## 2022-02-01 NOTE — Patient Instructions (Signed)
A few things to remember from today's visit:   Persistent cough for 3 weeks or longer - Plan: CT Chest Wo Contrast, CBC  Reactive airway disease without asthma - Plan: budesonide-formoterol (SYMBICORT) 160-4.5 MCG/ACT inhaler, CBC  Allergic rhinitis, unspecified seasonality, unspecified trigger  Gastroesophageal reflux disease, unspecified whether esophagitis present - Plan: omeprazole (PRILOSEC) 40 MG capsule  Prediabetes - Plan: Basic metabolic panel, Hemoglobin A1c  Resume Symbicort, remember to swish after use. Omeprazole recommended because acid reflux when symptoms started, GERD could be aggravating cough at night.Take it 30 min before breakfast.  If you need refills for medications you take chronically, please call your pharmacy. Do not use My Chart to request refills or for acute issues that need immediate attention. If you send a my chart message, it may take a few days to be addressed, specially if I am not in the office.  Please be sure medication list is accurate. If a new problem present, please set up appointment sooner than planned today.

## 2022-02-01 NOTE — Telephone Encounter (Signed)
Seen in office by pcp today.

## 2022-03-01 ENCOUNTER — Ambulatory Visit (HOSPITAL_BASED_OUTPATIENT_CLINIC_OR_DEPARTMENT_OTHER)
Admission: RE | Admit: 2022-03-01 | Discharge: 2022-03-01 | Disposition: A | Discharge status: Home / Self Care | Payer: Medicare Other | Source: Ambulatory Visit | Attending: Family Medicine | Admitting: Family Medicine

## 2022-03-01 DIAGNOSIS — R918 Other nonspecific abnormal finding of lung field: Secondary | ICD-10-CM | POA: Diagnosis not present

## 2022-03-01 DIAGNOSIS — R053 Chronic cough: Secondary | ICD-10-CM | POA: Diagnosis not present

## 2022-03-08 ENCOUNTER — Encounter: Payer: Self-pay | Admitting: Family Medicine

## 2022-03-08 DIAGNOSIS — I700 Atherosclerosis of aorta: Secondary | ICD-10-CM

## 2022-03-08 HISTORY — DX: Atherosclerosis of aorta: I70.0

## 2022-04-05 ENCOUNTER — Ambulatory Visit: Payer: Medicare Other

## 2022-04-20 ENCOUNTER — Ambulatory Visit
Admission: RE | Admit: 2022-04-20 | Discharge: 2022-04-20 | Disposition: A | Discharge status: Home / Self Care | Payer: Medicare Other | Source: Ambulatory Visit | Attending: Family Medicine | Admitting: Family Medicine

## 2022-04-20 DIAGNOSIS — Z1231 Encounter for screening mammogram for malignant neoplasm of breast: Secondary | ICD-10-CM

## 2022-08-15 ENCOUNTER — Encounter: Payer: Self-pay | Admitting: *Deleted

## 2022-11-09 ENCOUNTER — Other Ambulatory Visit: Payer: Self-pay

## 2022-11-09 ENCOUNTER — Emergency Department (HOSPITAL_BASED_OUTPATIENT_CLINIC_OR_DEPARTMENT_OTHER): Payer: Medicare Other

## 2022-11-09 ENCOUNTER — Encounter (HOSPITAL_BASED_OUTPATIENT_CLINIC_OR_DEPARTMENT_OTHER): Payer: Self-pay

## 2022-11-09 ENCOUNTER — Emergency Department (HOSPITAL_BASED_OUTPATIENT_CLINIC_OR_DEPARTMENT_OTHER)
Admission: EM | Admit: 2022-11-09 | Discharge: 2022-11-09 | Disposition: A | Discharge status: Home / Self Care | Payer: Medicare Other | Attending: Emergency Medicine | Admitting: Emergency Medicine

## 2022-11-09 DIAGNOSIS — R079 Chest pain, unspecified: Secondary | ICD-10-CM | POA: Diagnosis not present

## 2022-11-09 LAB — CBC
HCT: 37.7 % (ref 36.0–46.0)
Hemoglobin: 12.5 g/dL (ref 12.0–15.0)
MCH: 28.6 pg (ref 26.0–34.0)
MCHC: 33.2 g/dL (ref 30.0–36.0)
MCV: 86.3 fL (ref 80.0–100.0)
Platelets: 232 10*3/uL (ref 150–400)
RBC: 4.37 MIL/uL (ref 3.87–5.11)
RDW: 12.9 % (ref 11.5–15.5)
WBC: 5.2 10*3/uL (ref 4.0–10.5)
nRBC: 0 % (ref 0.0–0.2)

## 2022-11-09 LAB — BASIC METABOLIC PANEL
Anion gap: 12 (ref 5–15)
BUN: 14 mg/dL (ref 8–23)
CO2: 25 mmol/L (ref 22–32)
Calcium: 9.7 mg/dL (ref 8.9–10.3)
Chloride: 105 mmol/L (ref 98–111)
Creatinine, Ser: 0.87 mg/dL (ref 0.44–1.00)
GFR, Estimated: >60 mL/min (ref >60)
Glucose, Bld: 144 mg/dL — ABNORMAL HIGH (ref 70–99)
Potassium: 3.9 mmol/L (ref 3.5–5.1)
Sodium: 142 mmol/L (ref 135–145)

## 2022-11-09 LAB — I-STAT CHEM 8, ED
BUN: 14 mg/dL (ref 8–23)
Calcium, Ion: 1.18 mmol/L (ref 1.15–1.40)
Chloride: 107 mmol/L (ref 98–111)
Creatinine, Ser: 0.8 mg/dL (ref 0.44–1.00)
Glucose, Bld: 114 mg/dL — ABNORMAL HIGH (ref 70–99)
HCT: 37 % (ref 36.0–46.0)
Hemoglobin: 12.6 g/dL (ref 12.0–15.0)
Potassium: 5 mmol/L (ref 3.5–5.1)
Sodium: 141 mmol/L (ref 135–145)
TCO2: 26 mmol/L (ref 22–32)

## 2022-11-09 LAB — TROPONIN I (HIGH SENSITIVITY)
Troponin I (High Sensitivity): 2 ng/L (ref <18)
Troponin I (High Sensitivity): <2 ng/L (ref <18)

## 2022-11-09 MED ORDER — FAMOTIDINE IN NACL 20-0.9 MG/50ML-% IV SOLN
20.0000 mg | Freq: Once | INTRAVENOUS | Status: AC
  Administered 2022-11-09: 20 mg via INTRAVENOUS
  Filled 2022-11-09: qty 50

## 2022-11-09 MED ORDER — MORPHINE SULFATE (PF) 4 MG/ML IV SOLN
4.0000 mg | Freq: Once | INTRAVENOUS | Status: AC
  Administered 2022-11-09: 4 mg via INTRAVENOUS
  Filled 2022-11-09: qty 1

## 2022-11-09 MED ORDER — METHOCARBAMOL 500 MG PO TABS: 500.0000 mg | ORAL_TABLET | Freq: Three times a day (TID) | ORAL | 0 refills | Status: DC | PRN

## 2022-11-09 MED ORDER — IOHEXOL 350 MG/ML SOLN
75.0000 mL | Freq: Once | INTRAVENOUS | Status: AC | PRN
  Administered 2022-11-09: 75 mL via INTRAVENOUS

## 2022-11-09 NOTE — ED Triage Notes (Signed)
Pt arrives with c/o midsternal chest pain that started last night. Pt denies SOB or n/v.

## 2022-11-09 NOTE — Discharge Instructions (Signed)
As we discussed, your heart enzyme tests and your CT scan was normal   Please take Robaxin as needed for possible muscle spasm  If you have persistent pain I have referred you to cardiology for further testing  Return to ER if you have worse chest pain or shortness of breath.

## 2022-11-09 NOTE — ED Provider Notes (Signed)
[facility] Provider Note   CSN: [id number] Arrival date & time: 11/09/22  2020     History  Chief Complaint  Patient presents with   Chest Pain    Kathryn Phillips is a 72 y.o. female history of trigger, here presenting with chest pain.  Patient just came back from [facility] recently.  Patient states that she had chest pain since yesterday.  She states that it is constant since yesterday.  She states that it is not worse with food or exertion.  She states that she has a hard time taking a deep breath.  Denies any fevers or chills.  The history is provided by the patient.       Home Medications Prior to Admission medications   Medication Sig Start Date End Date Taking? Authorizing Provider  acetaminophen (TYLENOL) 100 MG/ML solution Take 10 mg/kg by mouth every 4 (four) hours as needed. Patient used this medication for pain.    [provider]  albuterol (VENTOLIN HFA) 108 (90 Base) MCG/ACT inhaler Inhale 2 puffs into the lungs every 6 (six) hours as needed for wheezing or shortness of breath.    [provider]  budesonide-formoterol (SYMBICORT) 160-4.5 MCG/ACT inhaler INHALE 2 PUFFS INTO THE LUNGS TWICE A DAY 02/01/22   [place], Betty G, MD  fluticasone [facility]) 50 MCG/ACT nasal spray Place 1 spray into both nostrils daily. 12/24/21   Deeann Saint, MD  Multiple Vitamin (MULTIVITAMIN) tablet Take 1 tablet by mouth daily.    [provider]  omeprazole (PRILOSEC) 40 MG capsule Take 1 capsule (40 mg total) by mouth daily. 02/01/22   [place], Betty G, MD      Allergies    Hydrocodone    Review of Systems   Review of Systems  Cardiovascular:  Positive for chest pain.  All other systems reviewed and are negative.   Physical Exam Updated Vital Signs BP (!) 152/74 (BP Location: Left Arm)   Pulse 90   Temp 97.9 F (36.6 C) (Oral)   Resp 16   Wt 80.7 kg   SpO2 99%   BMI 28.73 kg/m  Physical  Exam Vitals and nursing note reviewed.  Constitutional:      Comments: Slightly uncomfortable  HENT:     Head: Normocephalic.  Eyes:     Extraocular Movements: Extraocular movements intact.     Pupils: Pupils are equal, round, and reactive to light.  Cardiovascular:     Rate and Rhythm: Normal rate and regular rhythm.     Heart sounds: Normal heart sounds.  Pulmonary:     Effort: Pulmonary effort is normal.     Breath sounds: Normal breath sounds.  Chest:     Comments: No reproducible chest wall tenderness Abdominal:     General: Bowel sounds are normal.     Palpations: Abdomen is soft.  Musculoskeletal:        General: Normal range of motion.     Cervical back: Normal range of motion and neck supple.  Skin:    General: Skin is warm.     Capillary Refill: Capillary refill takes less than 2 seconds.  Neurological:     General: No focal deficit present.     Mental Status: She is alert and oriented to person, place, and time.  Psychiatric:        Mood and Affect: Mood normal.        Behavior: Behavior normal.     ED Results /  Procedures / Treatments   Labs (all labs ordered are listed, but only abnormal results are displayed) Labs Reviewed  CBC  BASIC METABOLIC PANEL  I-STAT CHEM 8, ED  TROPONIN I (HIGH SENSITIVITY)    EKG EKG Interpretation Date/Time:  Wednesday November 09 2022 20:29:46 EDT Ventricular Rate:  91 PR Interval:  181 QRS Duration:  77 QT Interval:  352 QTC Calculation: 433 R Axis:   71  Text Interpretation: Sinus rhythm Probable left atrial enlargement No significant change since last tracing Confirmed by Richardean Canal [phone]) on 11/09/2022 9:18:49 PM  Radiology DG Chest 2 View  Result Date: 11/09/2022 CLINICAL DATA:  Chest pain. EXAM: CHEST - 2 VIEW COMPARISON:  January 26, 2022 FINDINGS: The heart size and mediastinal contours are within normal limits. There is no evidence of acute infiltrate, pleural effusion or pneumothorax. A radiopaque fusion  plate and screws are seen overlying the lower cervical spine. The visualized skeletal structures are unremarkable. IMPRESSION: No active cardiopulmonary disease. Electronically Signed   By: Aram Candela M.D.   On: 11/09/2022 20:46    Procedures Procedures    Medications Ordered in ED Medications  famotidine (PEPCID) IVPB 20 mg premix (has no administration in time range)  morphine (PF) 4 MG/ML injection 4 mg (4 mg Intravenous Given 11/09/22 2136)    ED Course/ Medical Decision Making/ A&P                             Medical Decision Making Kathryn Phillips is a 72 y.o. female presenting with chest pain.  Patient recently came back from [facility].  Concern for possible PE versus ACS.  Plan to get CBC and BMP and troponin x 2 and CTA chest.  10:55 PM I reviewed patient's labs and independently interpreted imaging studies.  Patient's troponin is negative x 2.  CTA chest showed atelectasis but no PEs.  Patient's pain is under control.  Patient states that it is worse with movement so I wonder if it is MSK in nature.  Will start muscle relaxants as needed.  Will have patient follow-up with cardiology as needed.  Problems Addressed: Chest pain, unspecified type: acute illness or injury  Amount and/or Complexity of Data Reviewed Labs: ordered. Decision-making details documented in ED Course. Radiology: ordered and independent interpretation performed. Decision-making details documented in ED Course.  Risk Prescription drug management.    Final Clinical Impression(s) / ED Diagnoses Final diagnoses:  None    Rx / DC Orders ED Discharge Orders     None         Charlynne Pander, MD 11/09/22 2256

## 2022-12-20 ENCOUNTER — Ambulatory Visit: Payer: Federal, State, Local not specified - PPO

## 2022-12-20 ENCOUNTER — Other Ambulatory Visit: Payer: Self-pay

## 2022-12-20 DIAGNOSIS — R059 Cough, unspecified: Secondary | ICD-10-CM | POA: Insufficient documentation

## 2023-02-03 ENCOUNTER — Ambulatory Visit: Payer: Federal, State, Local not specified - PPO

## 2023-02-27 ENCOUNTER — Ambulatory Visit: Payer: Medicare Other

## 2023-02-27 VITALS — BP 122/68 | HR 74 | Ht 65.0 in | Wt 181.0 lb

## 2023-02-27 DIAGNOSIS — I2510 Atherosclerotic heart disease of native coronary artery without angina pectoris: Secondary | ICD-10-CM

## 2023-02-27 DIAGNOSIS — R011 Cardiac murmur, unspecified: Secondary | ICD-10-CM | POA: Diagnosis not present

## 2023-02-27 DIAGNOSIS — I700 Atherosclerosis of aorta: Secondary | ICD-10-CM

## 2023-02-27 DIAGNOSIS — R0789 Other chest pain: Secondary | ICD-10-CM

## 2023-02-27 MED ORDER — METOPROLOL TARTRATE 50 MG PO TABS: ORAL_TABLET | ORAL | 0 refills | Status: DC

## 2023-02-27 NOTE — Assessment & Plan Note (Signed)
Soft flow murmur, best heard on examination and breath-hold, parasternal.  Likely a benign flow murmur.  Will obtain a transthoracic echocardiogram for cardiac structure and function assessment.

## 2023-02-27 NOTE — Assessment & Plan Note (Addendum)
Assist coronary atherosclerosis risk in people associated with aortic atherosclerosis. Overall cardiovascular risk factors also reviewed. Obtaining further CAD workup with CT coronary imaging as reviewed below. Based on the results we will discuss further pharmacotherapy if indicated.   No lipid panel available, will obtain fasting lipid panel and also formal hemoglobin A1c level to be checked. If diabetes confirmed, would encourage her to be started on moderate intensity statin such as atorvastatin 20 mg once daily or rosuvastatin 5 mg once daily.

## 2023-02-27 NOTE — Progress Notes (Addendum)
Cardiology Consultation:    Date:  02/27/2023   ID:  Kathryn Phillips, DOB 11/06/1950, MRN [id number]  PCP:  [place], Betty G, MD  Cardiologist:  Marlyn Corporal Orlena Garmon, MD   Referring MD: Charlynne Pander, MD   No chief complaint on file.    ASSESSMENT AND PLAN:   72 year old woman with history of diabetes mellitus recently diagnosed, asthma/COPD, GERD, aortic atherosclerosis on CT imaging of the chest, no prior cardiac history with symptoms of atypical chest discomfort leading to an ER visit in July, couple instances of chest discomfort since then.  Problem List Items Addressed This Visit     Atherosclerosis of aorta (HCC) - Primary    Assist coronary atherosclerosis risk in people associated with aortic atherosclerosis. Overall cardiovascular risk factors also reviewed. Obtaining further CAD workup with CT coronary imaging as reviewed below. Based on the results we will discuss further pharmacotherapy if indicated.   No lipid panel available, will obtain fasting lipid panel and also formal hemoglobin A1c level to be checked. If diabetes confirmed, would encourage her to be started on moderate intensity statin such as atorvastatin 20 mg once daily or rosuvastatin 5 mg once daily.       Relevant Medications   metoprolol tartrate (LOPRESSOR) 50 MG tablet   Other Relevant Orders   EKG 12-Lead (Completed)   Hemoglobin A1c   Lipid panel   Chest discomfort    Atypical symptoms.  Cannot exclude cardiac causes given her risk factors of age, diabetes mellitus.  Will further assess with CTA coronary angiogram. Discussed the test, use of contrast, need for medications such as metoprolol to lower the heart rate.  Alternatives with stress test reviewed. Given her overall risk and help to quantify coronary atherosclerosis in the setting of aortic atherosclerosis noted on CT chest, CTA coronary angiogram was felt to be the most appropriate.  Will schedule. Will order metoprolol  tartrate 50 mg dose to be used on the night before and 50 mg on the morning of the test.   Will obtain transthoracic echocardiogram for cardiac structure and function assessment.       Relevant Medications   metoprolol tartrate (LOPRESSOR) 50 MG tablet   Other Relevant Orders   CT CORONARY MORPH W/CTA COR W/SCORE W/CA W/CM &/OR WO/CM   Comprehensive metabolic panel   ECHOCARDIOGRAM COMPLETE   Heart murmur    Soft flow murmur, best heard on examination and breath-hold, parasternal.  Likely a benign flow murmur.  Will obtain a transthoracic echocardiogram for cardiac structure and function assessment.      Relevant Orders   ECHOCARDIOGRAM COMPLETE   Return to clinic in 6 to 8 weeks.  Addendum: 03/16/2023 CT coronary angiogram calcium score was 9.28 and maximum narrowing/stenosis observed was less than 25%, CAD RADS 1 study.  blood work recently from 03/13/2023 noted elevated cholesterol levels DL was elevated at 563.  Total cholesterol was 230.  Recommended Crestor 5 mg once daily if agreeable. Will review echocardiogram results once available and await radiologist over read of the CT coronary angiogram regarding any extracardiac findings.  History of Present Illness:    Kathryn Phillips is a 72 y.o. female who is being seen today for the evaluation of chest pain at the request of Charlynne Pander, MD.  Pleasant woman here for the visit by herself.  Keeps herself busy with household work and frequently travels to care of her mom who is in her 90s and lives about 2 hours away.  She has history of asthma/COPD, GERD, diabetes mellitus based on recent point-of-care hemoglobin A1c check noted to be 6.5 in October 2023 and pending further visits with PCP. Denies any prior history of CAD, CHF, MI, CVA.  She had a visit to the emergency room November 09, 2022 for chest pain.  Troponins were unremarkable.  EKG showed no significant findings suggestive of ischemia.  CTA chest  unremarkable for PE, aneurysm of the aorta or dissection.  It did show aortic atherosclerosis.  Mentions she had been relatively symptom-free with couple episodes of chest discomfort in the past 2 months, with the last episode about 4 weeks ago while at rest and lasting for few minutes. She is unclear with regards to triggering causes and relieving factors.  Denies any palpitations, orthopnea, shortness of breath, syncopal or near syncopal episodes.  No pedal edema.  Never smoked. Drinks wine on occasions with her dinner. No recreational drug use.  EKG in the clinic today shows sinus rhythm heart rate 74/min, PR interval 194 ms.  QTc normal 439 ms.  In comparison prior EKG from 11/09/2022 was similar.  Last blood work available to review from November 09, 2022 unremarkable hemoglobin, hematocrit 12.5/37.7, WBC 5.2, platelets 232 Normal electrolytes, renal function with creatinine 0.87 and EGFR greater than 60.  Last stress test with nuclear imaging from July 2000 showed normal mild perfusion.   Past Medical History:  Diagnosis Date   Allergic rhinitis 07/19/2010   ARM PAIN, LEFT 11/07/2007   Qualifier: Diagnosis of   By: Cato Mulligan MD, Bruce         Atherosclerosis of aorta (HCC) 03/08/2022   Seen on chest CT on 03/04/22.     BACK PAIN, UPPER 02/18/2009   Qualifier: Diagnosis of   By: Caryl Never MD, Bruce      Replacing diagnoses that were inactivated after the 08/01/22 regulatory import     Class 1 obesity with body mass index (BMI) of 31.0 to 31.9 in adult 04/23/2019   Cough    Insomnia 04/23/2019   Osteopenia after menopause 05/14/2019   05/10/2019 DEXA:  Results:               Lumbar spine L1-L4    Femoral neck (FN)      T-score    -1.8    RFN: -1.8  LFN: -1.7        Dx:Low Bone Density      FRAX 10-year fracture risk calculator: 4.3 % for any major fracture and 0.6 % for hip fracture.     Trigger finger of right thumb 03/11/2020    Past Surgical History:  Procedure Laterality Date    CESAREAN SECTION     myonectomy     NASAL SINUS SURGERY     SHOULDER ARTHROSCOPY W/ ROTATOR CUFF REPAIR      Current Medications: Current Meds  Medication Sig   acetaminophen (TYLENOL) 100 MG/ML solution Take 10 mg/kg by mouth every 4 (four) hours as needed for pain.   albuterol (VENTOLIN HFA) 108 (90 Base) MCG/ACT inhaler Inhale 2 puffs into the lungs every 6 (six) hours as needed for wheezing or shortness of breath.   budesonide-formoterol (SYMBICORT) 160-4.5 MCG/ACT inhaler INHALE 2 PUFFS INTO THE LUNGS TWICE A DAY   fluticasone (FLONASE) 50 MCG/ACT nasal spray Place 1 spray into both nostrils daily.   methocarbamol (ROBAXIN) 500 MG tablet Take 1 tablet (500 mg total) by mouth every 8 (eight) hours as needed for muscle spasms.   metoprolol tartrate (LOPRESSOR) 50  MG tablet Take 50 mg Lopressor the night before and 2 hours prior to your CT scan for a heart rate greater than 55.   Multiple Vitamin (MULTIVITAMIN) tablet Take 1 tablet by mouth daily.   omeprazole (PRILOSEC) 40 MG capsule Take 1 capsule (40 mg total) by mouth daily.     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  Tobacco Use   Smoking status: Never   Smokeless tobacco: Never  Vaping Use   Vaping status: Never Used  Substance and Sexual Activity   Alcohol use: Not Currently   Drug use: No   Sexual activity: Not on file  Other Topics Concern   Not on file  Social History Narrative   Married   1 son   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   Social Determinants of Health   Financial Resource Strain: Low Risk  (01/31/2022)   Overall Financial Resource Strain (CARDIA)     Difficulty of Paying Living Expenses: Not hard at all  Food Insecurity: No Food Insecurity (01/31/2022)   Hunger Vital Sign    Worried About Running Out of Food in the Last Year: Never true    Ran Out of Food in the Last Year: Never true  Transportation Needs: No Transportation Needs (01/31/2022)   PRAPARE - Administrator, Civil Service (Medical): No    Lack of Transportation (Non-Medical): No  Physical Activity: Inactive (01/31/2022)   Exercise Vital Sign    Days of Exercise per Week: 0 days    Minutes of Exercise per Session: 0 min  Stress: No Stress Concern Present (01/31/2022)   [employer] of Occupational Health - Occupational Stress Questionnaire    Feeling of Stress : Not at all  Social Connections: Socially Integrated (01/09/2021)   Social Connection and Isolation Panel [NHANES]    Frequency of Communication with Friends and Family: More than three times a week    Frequency of Social Gatherings with Friends and Family: Three times a week    Attends Religious Services: More than 4 times per year    Active Member of Clubs or Organizations: Yes    Attends Engineer, structural: More than 4 times per year    Marital Status: Married     Family History: The patient's family history includes Diabetes in her father. There is no history of Colon cancer, Pancreatic cancer, Rectal cancer, or Stomach cancer. ROS:   Please see the history of present illness.    All 14 point review of systems negative except as described per history of present illness.  EKGs/Labs/Other Studies Reviewed:    The following studies were reviewed today:   EKG:  EKG Interpretation Date/Time:  Monday February 27 2023 15:03:12 EDT Ventricular Rate:  74 PR Interval:  194 QRS Duration:  86 QT Interval:  396 QTC Calculation: 439 R Axis:   69  Text Interpretation: Normal sinus rhythm Normal ECG When compared with ECG of 09-Nov-2022 20:29, PREVIOUS ECG IS PRESENT Confirmed by Huntley Dec reddy [phone]) on 02/27/2023 3:31:44 PM    Recent Labs: 11/09/2022: BUN 14; Creatinine, Ser 0.80; Hemoglobin 12.6; Platelets 232;  Potassium 5.0; Sodium 141  Recent Lipid Panel No results found for: "CHOL", "TRIG", "HDL", "CHOLHDL", "VLDL", "LDLCALC", "LDLDIRECT"  Physical Exam:    VS:  BP 122/68   Pulse 74   Ht 5\' 5"  (1.651 m)   Wt 181 lb 0.6 oz (82.1 kg)   SpO2 97%   BMI 30.13 kg/m     Wt Readings from Last 3 Encounters:  02/27/23 181 lb 0.6 oz (82.1 kg)  11/09/22 178 lb (80.7 kg)  02/01/22 177 lb 4 oz (80.4 kg)     GENERAL:  Well nourished, well developed in no acute distress NECK: No JVD; No carotid bruits CARDIAC: RRR, S1 and S2 present, soft 3/6 systolic murmur likely benign flow murmur. CHEST:  Clear to auscultation without rales, wheezing or rhonchi  Extremities: No pitting pedal edema. Pulses bilaterally symmetric with radial 2+ and dorsalis pedis 2+ NEUROLOGIC:  Alert and oriented x 3  Medication Adjustments/Labs and Tests Ordered: Current medicines are reviewed at length with the patient today.  Concerns regarding medicines are outlined above.  Orders Placed This Encounter  Procedures   CT CORONARY MORPH W/CTA COR W/SCORE W/CA W/CM &/OR WO/CM   Comprehensive metabolic panel   Hemoglobin A1c   Lipid panel   EKG 12-Lead   ECHOCARDIOGRAM COMPLETE   Meds ordered this encounter  Medications   metoprolol tartrate (LOPRESSOR) 50 MG tablet    Sig: Take 50 mg Lopressor the night before and 2 hours prior to your CT scan for a heart rate greater than 55.    Dispense:  2 tablet    Refill:  0    Signed, Yolanda Huffstetler reddy Latavia Goga, MD, MPH, [facility]. 02/27/2023 4:00 PM    [facility]

## 2023-02-27 NOTE — Patient Instructions (Addendum)
Medication Instructions:  Your physician recommends that you continue on your current medications as directed. Please refer to the Current Medication list given to you today.   *If you need a refill on your cardiac medications before your next appointment, please call your pharmacy*   Lab Work: Your physician recommends that you return for lab work in: the next few days for CMP, A1C and lipids. You need to have labs done when you are fasting. [facility] lab is located on the 3rd floor, [address]. Hours are Monday - Friday 8 am to 4 pm, closed 11:30 am to 1:00 pm. You do NOT need an appointment.    If you have labs (blood work) drawn today and your tests are completely normal, you will receive your results only by: MyChart Message (if you have MyChart) OR A paper copy in the mail If you have any lab test that is abnormal or we need to change your treatment, we will call you to review the results.   Testing/Procedures: Your physician has requested that you have an echocardiogram. Echocardiography is a painless test that uses sound waves to create images of your heart. It provides your doctor with information about the size and shape of your heart and how well your heart's chambers and valves are working. This procedure takes approximately one hour. There are no restrictions for this procedure. Please do NOT wear cologne, perfume, aftershave, or lotions (deodorant is allowed). Please arrive 15 minutes prior to your appointment time.     Your cardiac CT will be scheduled at one of the below locations:    [facility] [address] Floor, [address] [place],  [place]  [address]  Main: [phone]  Please follow these instructions carefully (unless otherwise directed):  On the Night Before the Test: Be sure to Drink plenty of water. Do not consume any caffeinated/decaffeinated beverages or chocolate 12 hours prior to your test. Do not take any  antihistamines 12 hours prior to your test.   On the Day of the Test: Drink plenty of water until 1 hour prior to the test. Do not eat any food 1 hour prior to test. You may take your regular medications prior to the test.  Take metoprolol (Lopressor) 50 mg the night before the CT and two hours prior to test. HOLD Furosemide/Hydrochlorothiazide morning of the test. FEMALES- please wear underwire-free bra if available, avoid dresses & tight clothing       After the Test: Drink plenty of water. After receiving IV contrast, you may experience a mild flushed feeling. This is normal. On occasion, you may experience a mild rash up to 24 hours after the test. This is not dangerous. If this occurs, you can take Benadryl 25 mg and increase your fluid intake. If you experience trouble breathing, this can be serious. If it is severe call 911 IMMEDIATELY. If it is mild, please call our office. If you take any of these medications: Glipizide/Metformin, Avandament, Glucavance, please do not take 48 hours after completing test unless otherwise instructed.  We will call to schedule your test 2-4 weeks out understanding that some insurance companies will need an authorization prior to the service being performed.   For non-scheduling related questions, please contact the cardiac imaging nurse navigator should you have any questions/concerns: Rockwell Alexandria, Cardiac Imaging Nurse Navigator Larey Brick, Cardiac Imaging Nurse Navigator [facility] Direct Office Dial: [phone]   For scheduling needs, including  cancellations and rescheduling, please call [place], [phone].   Your next appointment:   6-8 week(s)  The format for your next appointment:   In Person  Provider:   Huntley Dec, MD   Other Instructions Cardiac CT Angiogram A cardiac CT angiogram is a procedure to look at the heart and the area around the heart. It may be done to help find the  cause of chest pains or other symptoms of heart disease. During this procedure, a substance called contrast dye is injected into the blood vessels in the area to be checked. A large X-ray machine, called a CT scanner, then takes detailed pictures of the heart and the surrounding area. The procedure is also sometimes called a coronary CT angiogram, coronary artery scanning, or CTA. A cardiac CT angiogram allows the health care provider to see how well blood is flowing to and from the heart. The health care provider will be able to see if there are any problems, such as: Blockage or narrowing of the coronary arteries in the heart. Fluid around the heart. Signs of weakness or disease in the muscles, valves, and tissues of the heart. Tell a health care provider about: Any allergies you have. This is especially important if you have had a previous allergic reaction to contrast dye. All medicines you are taking, including vitamins, herbs, eye drops, creams, and over-the-counter medicines. Any blood disorders you have. Any surgeries you have had. Any medical conditions you have. Whether you are pregnant or may be pregnant. Any anxiety disorders, chronic pain, or other conditions you have that may increase your stress or prevent you from lying still. What are the risks? Generally, this is a safe procedure. However, problems may occur, including: Bleeding. Infection. Allergic reactions to medicines or dyes. Damage to other structures or organs. Kidney damage from the contrast dye that is used. Increased risk of cancer from radiation exposure. This risk is low. Talk with your health care provider about: The risks and benefits of testing. How you can receive the lowest dose of radiation. What happens before the procedure? Wear comfortable clothing and remove any jewelry, glasses, dentures, and hearing aids. Follow instructions from your health care provider about eating and drinking. This may  include: For 12 hours before the procedure -- avoid caffeine. This includes tea, coffee, soda, energy drinks, and diet pills. Drink plenty of water or other fluids that do not have caffeine in them. Being well hydrated can prevent complications. For 4-6 hours before the procedure -- stop eating and drinking. The contrast dye can cause nausea, but this is less likely if your stomach is empty. Ask your health care provider about changing or stopping your regular medicines. This is especially important if you are taking diabetes medicines, blood thinners, or medicines to treat problems with erections (erectile dysfunction). What happens during the procedure?  Hair on your chest may need to be removed so that small sticky patches called electrodes can be placed on your chest. These will transmit information that helps to monitor your heart during the procedure. An IV will be inserted into one of your veins. You might be given a medicine to control your heart rate during the procedure. This will help to ensure that good images are obtained. You will be asked to lie on an exam table. This table will slide in and out of the CT machine during the procedure. Contrast dye will be injected into the IV. You might feel warm, or you may get a metallic taste  in your mouth. You will be given a medicine called nitroglycerin. This will relax or dilate the arteries in your heart. The table that you are lying on will move into the CT machine tunnel for the scan. The person running the machine will give you instructions while the scans are being done. You may be asked to: Keep your arms above your head. Hold your breath. Stay very still, even if the table is moving. When the scanning is complete, you will be moved out of the machine. The IV will be removed. The procedure may vary among health care providers and hospitals. What can I expect after the procedure? After your procedure, it is common to have: A metallic  taste in your mouth from the contrast dye. A feeling of warmth. A headache from the nitroglycerin. Follow these instructions at home: Take over-the-counter and prescription medicines only as told by your health care provider. If you are told, drink enough fluid to keep your urine pale yellow. This will help to flush the contrast dye out of your body. Most people can return to their normal activities right after the procedure. Ask your health care provider what activities are safe for you. It is up to you to get the results of your procedure. Ask your health care provider, or the department that is doing the procedure, when your results will be ready. Keep all follow-up visits as told by your health care provider. This is important. Contact a health care provider if: You have any symptoms of allergy to the contrast dye. These include: Shortness of breath. Rash or hives. A racing heartbeat. Summary A cardiac CT angiogram is a procedure to look at the heart and the area around the heart. It may be done to help find the cause of chest pains or other symptoms of heart disease. During this procedure, a large X-ray machine, called a CT scanner, takes detailed pictures of the heart and the surrounding area after a contrast dye has been injected into blood vessels in the area. Ask your health care provider about changing or stopping your regular medicines before the procedure. This is especially important if you are taking diabetes medicines, blood thinners, or medicines to treat erectile dysfunction. If you are told, drink enough fluid to keep your urine pale yellow. This will help to flush the contrast dye out of your body. This information is not intended to replace advice given to you by your health care provider. Make sure you discuss any questions you have with your health care provider. Document Revised: 12/12/2018 Document Reviewed: 12/12/2018 Elsevier Patient Education  [employer].  Echocardiogram An echocardiogram is a test that uses sound waves to make images of your heart. This way of making images is often called ultrasound. The images from this test can help find out many things about your heart, including: The size and shape of your heart. The strength of your heart muscle and how well it's working. The size, thickness, and movement of your heart's walls. How your heart valves are working. Problems such as: A tumor or a growth from an infection around the heart valves. Areas of heart muscle that aren't working well because of poor blood flow or injury from a heart attack. An aneurysm. This is a weak or damaged part of an artery wall. An artery is a blood vessel. Tell a health care provider about: Any allergies you have. All medicines you're taking, including vitamins, herbs, eye drops, creams, and over-the-counter medicines. Any  bleeding problems you have. Any surgeries you've had. Any medical problems you have. Whether you're pregnant or may be pregnant. What are the risks? Your health care provider will talk with you about risks. These may include an allergic reaction to IV dye that may be used during the test. What happens before the test? You don't need to do anything to get ready for this test. You may eat and drink normally. What happens during the test?  You'll take off your clothes from the waist up and put on a hospital gown. Sticky patches called electrodes may be placed on your chest. These will be connected to a machine that monitors your heart rate and rhythm. You'll lie down on a table for the exam. A wand covered in gel will be moved over your chest. Sound waves from the wand will go to your heart and bounce back--or "echo" back. The sound waves will go to a computer that uses them to make images of your heart. The images can be viewed on a monitor. The images will also be recorded on the computer so your provider can look at them  later. You may be asked to change positions or hold your breath for a short time. This makes it easier to get different views or better views of your heart. In some cases, you may be given a dye through an IV. The IV is put into one of your veins. This dye can make the areas of your heart easier to see. The procedure may vary among providers and hospitals. What can I expect after the test? You may return to your normal diet, activities, and medicines unless your provider tells you not to. If an IV was placed for the test, it will be removed. It's up to you to get the results of your test. Ask your provider, or the department that's doing the test, when your results will be ready. This information is not intended to replace advice given to you by your health care provider. Make sure you discuss any questions you have with your health care provider. Document Revised: 06/17/2022 Document Reviewed: 06/17/2022 Elsevier Patient Education  2024 [employer].

## 2023-02-27 NOTE — Assessment & Plan Note (Addendum)
Atypical symptoms.  Cannot exclude cardiac causes given her risk factors of age, diabetes mellitus.  Will further assess with CTA coronary angiogram. Discussed the test, use of contrast, need for medications such as metoprolol to lower the heart rate.  Alternatives with stress test reviewed. Given her overall risk and help to quantify coronary atherosclerosis in the setting of aortic atherosclerosis noted on CT chest, CTA coronary angiogram was felt to be the most appropriate.  Will schedule. Will order metoprolol tartrate 50 mg dose to be used on the night before and 50 mg on the morning of the test.   Will obtain transthoracic echocardiogram for cardiac structure and function assessment.

## 2023-03-13 ENCOUNTER — Telehealth (HOSPITAL_COMMUNITY): Payer: Self-pay | Admitting: *Deleted

## 2023-03-13 NOTE — Telephone Encounter (Signed)
Reaching out to patient to offer assistance regarding upcoming cardiac imaging study; pt verbalizes understanding of appt date/time, parking situation and where to check in, pre-test NPO status and medications ordered, and verified current allergies; name and call back number provided for further questions should they arise Hayley Sharpe RN Navigator Cardiac Imaging [facility] [phone] office [phone] cell  

## 2023-03-13 NOTE — Telephone Encounter (Signed)
Attempted to call patient regarding upcoming cardiac CT appointment. Left message on voicemail with name and callback number Hayley Sharpe RN Navigator Cardiac Imaging [facility] [phone] Office   

## 2023-03-14 ENCOUNTER — Ambulatory Visit (HOSPITAL_BASED_OUTPATIENT_CLINIC_OR_DEPARTMENT_OTHER)
Admission: RE | Admit: 2023-03-14 | Discharge: 2023-03-14 | Disposition: A | Discharge status: Home / Self Care | Payer: Medicare Other | Source: Ambulatory Visit

## 2023-03-14 ENCOUNTER — Encounter (HOSPITAL_BASED_OUTPATIENT_CLINIC_OR_DEPARTMENT_OTHER): Payer: Self-pay

## 2023-03-14 DIAGNOSIS — R0789 Other chest pain: Secondary | ICD-10-CM | POA: Insufficient documentation

## 2023-03-14 DIAGNOSIS — I2510 Atherosclerotic heart disease of native coronary artery without angina pectoris: Secondary | ICD-10-CM | POA: Insufficient documentation

## 2023-03-14 LAB — COMPREHENSIVE METABOLIC PANEL
ALT: 20 [IU]/L (ref 0–32)
AST: 19 [IU]/L (ref 0–40)
Albumin: 4.3 g/dL (ref 3.9–4.9)
Alkaline Phosphatase: 101 [IU]/L (ref 44–121)
BUN/Creatinine Ratio: 18 (ref 12–28)
BUN: 14 mg/dL (ref 8–27)
Bilirubin Total: 0.6 mg/dL (ref 0.0–1.2)
CO2: 24 mmol/L (ref 20–29)
Calcium: 9.4 mg/dL (ref 8.7–10.3)
Chloride: 108 mmol/L — ABNORMAL HIGH (ref 96–106)
Creatinine, Ser: 0.76 mg/dL (ref 0.57–1.00)
Globulin, Total: 2 g/dL (ref 1.5–4.5)
Glucose: 100 mg/dL — ABNORMAL HIGH (ref 70–99)
Potassium: 4.9 mmol/L (ref 3.5–5.2)
Sodium: 144 mmol/L (ref 134–144)
Total Protein: 6.3 g/dL (ref 6.0–8.5)
eGFR: 84 mL/min/{1.73_m2} (ref >59)

## 2023-03-14 LAB — LIPID PANEL
Chol/HDL Ratio: 2.7 ratio (ref 0.0–4.4)
Cholesterol, Total: 230 mg/dL — ABNORMAL HIGH (ref 100–199)
HDL: 86 mg/dL (ref >39)
LDL Chol Calc (NIH): 136 mg/dL — ABNORMAL HIGH (ref 0–99)
Triglycerides: 47 mg/dL (ref 0–149)
VLDL Cholesterol Cal: 8 mg/dL (ref 5–40)

## 2023-03-14 LAB — HEMOGLOBIN A1C
Est. average glucose Bld gHb Est-mCnc: 131 mg/dL
Hgb A1c MFr Bld: 6.2 % — ABNORMAL HIGH (ref 4.8–5.6)

## 2023-03-14 MED ORDER — NITROGLYCERIN 0.4 MG SL SUBL
0.8000 mg | SUBLINGUAL_TABLET | Freq: Once | SUBLINGUAL | Status: AC
  Administered 2023-03-14: 0.8 mg via SUBLINGUAL

## 2023-03-14 MED ORDER — IOHEXOL 350 MG/ML SOLN
100.0000 mL | Freq: Once | INTRAVENOUS | Status: AC | PRN
  Administered 2023-03-14: 95 mL via INTRAVENOUS

## 2023-03-14 NOTE — Progress Notes (Signed)
Patient presents for a cardiac CT and tolerated procedure without incident.   Patient ambulated out of department with a steady gait.

## 2023-03-16 DIAGNOSIS — I2510 Atherosclerotic heart disease of native coronary artery without angina pectoris: Secondary | ICD-10-CM | POA: Insufficient documentation

## 2023-03-21 ENCOUNTER — Other Ambulatory Visit: Payer: Self-pay

## 2023-03-21 MED ORDER — ROSUVASTATIN CALCIUM 5 MG PO TABS: 5.0000 mg | ORAL_TABLET | Freq: Every day | ORAL | 3 refills | Status: AC

## 2023-03-23 ENCOUNTER — Ambulatory Visit (HOSPITAL_BASED_OUTPATIENT_CLINIC_OR_DEPARTMENT_OTHER)
Admission: RE | Admit: 2023-03-23 | Discharge: 2023-03-23 | Disposition: A | Discharge status: Home / Self Care | Payer: Medicare Other | Source: Ambulatory Visit

## 2023-03-23 DIAGNOSIS — R011 Cardiac murmur, unspecified: Secondary | ICD-10-CM

## 2023-03-23 DIAGNOSIS — R0789 Other chest pain: Secondary | ICD-10-CM | POA: Diagnosis present

## 2023-03-23 LAB — ECHOCARDIOGRAM COMPLETE
AR max vel: 1.99 cm2
AV Area VTI: 2.11 cm2
AV Area mean vel: 2 cm2
AV Mean grad: 4 mm[Hg]
AV Peak grad: 8.1 mm[Hg]
Ao pk vel: 1.42 m/s
Area-P 1/2: 2.99 cm2
Calc EF: 58.6 %
S' Lateral: 2.1 cm
Single Plane A2C EF: 57.5 %
Single Plane A4C EF: 60.8 %

## 2023-04-24 ENCOUNTER — Ambulatory Visit: Payer: Medicare Other

## 2023-04-24 ENCOUNTER — Ambulatory Visit (INDEPENDENT_AMBULATORY_CARE_PROVIDER_SITE_OTHER): Payer: Medicare Other | Admitting: Family Medicine

## 2023-04-24 ENCOUNTER — Encounter: Payer: Self-pay | Admitting: Family Medicine

## 2023-04-24 VITALS — BP 136/75 | HR 71 | Ht 65.0 in | Wt 182.0 lb

## 2023-04-24 VITALS — BP 120/78 | HR 79 | Resp 16 | Ht 65.0 in | Wt 182.1 lb

## 2023-04-24 DIAGNOSIS — R519 Headache, unspecified: Secondary | ICD-10-CM

## 2023-04-24 DIAGNOSIS — Z6830 Body mass index (BMI) 30.0-30.9, adult: Secondary | ICD-10-CM

## 2023-04-24 DIAGNOSIS — R7303 Prediabetes: Secondary | ICD-10-CM | POA: Insufficient documentation

## 2023-04-24 DIAGNOSIS — G4700 Insomnia, unspecified: Secondary | ICD-10-CM | POA: Diagnosis not present

## 2023-04-24 DIAGNOSIS — E66811 Obesity, class 1: Secondary | ICD-10-CM | POA: Diagnosis not present

## 2023-04-24 DIAGNOSIS — E782 Mixed hyperlipidemia: Secondary | ICD-10-CM | POA: Insufficient documentation

## 2023-04-24 DIAGNOSIS — Z23 Encounter for immunization: Secondary | ICD-10-CM | POA: Diagnosis not present

## 2023-04-24 DIAGNOSIS — E785 Hyperlipidemia, unspecified: Secondary | ICD-10-CM | POA: Insufficient documentation

## 2023-04-24 DIAGNOSIS — I2510 Atherosclerotic heart disease of native coronary artery without angina pectoris: Secondary | ICD-10-CM | POA: Diagnosis not present

## 2023-04-24 DIAGNOSIS — R42 Dizziness and giddiness: Secondary | ICD-10-CM | POA: Diagnosis present

## 2023-04-24 MED ORDER — NORTRIPTYLINE HCL 10 MG PO CAPS: 10.0000 mg | ORAL_CAPSULE | Freq: Every day | ORAL | 1 refills | Status: DC

## 2023-04-24 NOTE — Assessment & Plan Note (Signed)
Last hemoglobin A1c 6.2 in 03/2023. Encourage consistency with a healthy lifestyle for diabetes prevention.

## 2023-04-24 NOTE — Patient Instructions (Addendum)
A few things to remember from today's visit:  Headache, unspecified headache type - Plan: nortriptyline (PAMELOR) 10 MG capsule  Prediabetes  Insomnia, unspecified type Nortriptyline 10 mg 30 min before bedtime. Headache diary. Arrange an eye exam. Monitor for new symptoms. Walk 10-15 min daily.  If you need refills for medications you take chronically, please call your pharmacy. Do not use My Chart to request refills or for acute issues that need immediate attention. If you send a my chart message, it may take a few days to be addressed, specially if I am not in the office.  Please be sure medication list is accurate. If a new problem present, please set up appointment sooner than planned today.

## 2023-04-24 NOTE — Assessment & Plan Note (Signed)
Last lipid panel 03/13/2023 total Strahl 230, HDL 86, LDL 136, triglycerides 47 Normal transaminases and alkaline phosphatase.  Was started on low-dose rosuvastatin 5 mg once daily after the CT coronary angiogram results.  Tolerating well. Repeat blood work with CMP and lipid panel in another 3 to 4 weeks to monitor liver functions and improvement in lipid levels.

## 2023-04-24 NOTE — Assessment & Plan Note (Signed)
No active cardiac symptoms at this time. Tolerating her current medications and Lipitor well. No significant cardiac structural and functional issues on echocardiogram.  Continue with lipid-lowering therapy as above.

## 2023-04-24 NOTE — Assessment & Plan Note (Addendum)
Chronic. This probably could be contributing to headaches. After discussion of some treatment options, she agrees with trying Nortriptyline 10 mg 30-minute before bedtime. We discussed some side effects. Also recommend improving sleep hygiene. Follow-up in 6 weeks.

## 2023-04-24 NOTE — Progress Notes (Signed)
Cardiology Consultation:    Date:  04/24/2023   ID:  Kathryn Phillips, Kathryn Phillips May 24, 1950, MRN [id number]  PCP:  Phillips, Kathryn G, MD  Cardiologist:  Kathryn Corporal Ronny Ruddell, MD   Referring MD: Phillips, Kathryn G, MD   Chief Complaint  Patient presents with   Follow-up     ASSESSMENT AND PLAN:   [name] 72 year old woman with mild nonobstructive coronary atherosclerosis on CT imaging 03/14/2023, diabetes mellitus, asthma/COPD, GERD, aortic atherosclerosis, dyslipidemia  Problem List Items Addressed This Visit     Coronary atherosclerosis, CT coronary angiogram 03/15/2023, calcium score 9.2, CAD RADS 1 study - Primary   No active cardiac symptoms at this time. Tolerating her current medications and Lipitor well. No significant cardiac structural and functional issues on echocardiogram.  Continue with lipid-lowering therapy as above.       Relevant Orders   Lipid panel   Comprehensive metabolic panel   Hyperlipidemia   Last lipid panel 03/13/2023 total Strahl 230, HDL 86, LDL 136, triglycerides 47 Normal transaminases and alkaline phosphatase.  Was started on low-dose rosuvastatin 5 mg once daily after the CT coronary angiogram results.  Tolerating well. Repeat blood work with CMP and lipid panel in another 3 to 4 weeks to monitor liver functions and improvement in lipid levels.       Relevant Orders   Lipid panel   Comprehensive metabolic panel   Postural lightheadedness   Brief episodes of lightheadedness on postural changes occurring over the last few weeks.  Symptoms appear to have improved over the past week. Likely precipitated by dehydration.  Encouraged adequate hydration throughout the day and caution changing posture from supine to sitting or sitting to standing or bending down and standing up.       Return to clinic in 1 year or as needed.   History of Present Illness:    [name] is a 72 y.o. female who is being seen today for follow-up  visit. Last visit with [place] was 02/27/2023. PCP is Phillips, Kathryn G, MD.   Pleasant woman here for the visit by herself.  She keeps herself busy with household work and trying to take care of her mom who lives 2 hours away.  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  Has history of diabetes mellitus, asthma/COPD, GERD, aortic atherosclerosis on CT imaging of the chest, atypical chest discomfort symptoms. CT coronary imaging 03/14/2023 after visit with [place] noted calcium score 9.28, CAD RADS 1 study with minimal nonobstructive disease of the left main.  Echocardiogram from 03/23/2023 noted LVEF 60 to 65%, grade 1 diastolic dysfunction, mild TR.  No other significant abnormalities.  Mentions overall she has been doing well.  No significant complaints.  She has had symptoms with the lightheadedness especially notable on bending down and standing up.  She has had couple instances of this back to back for few days over a week ago.  No significant symptoms in the past week.  She remembers stressed with work at home and may have not been well-hydrated. Denies any syncopal episodes, falls, near syncopal episodes.  Does not smoke.  Rare wine consumption with dinner. No recreational drug use.  Lipid panel 03/13/2023 total cholesterol 230, HDL 86, LDL 136, triglycerides 47. Hemoglobin A1c 6.2 BUN 14, creatinine 0.76, normal transaminases and alkaline phosphatase, normal sodium and potassium levels.   Past Medical History:  Diagnosis Date   Allergic rhinitis 07/19/2010   ARM PAIN, LEFT 11/07/2007   Qualifier: Diagnosis of  ByCato Mulligan MD, Bruce         Atherosclerosis of aorta [facility]) 03/08/2022   Seen on chest CT on 03/04/22.     BACK PAIN, UPPER 02/18/2009   Qualifier: Diagnosis of   By: Caryl Never MD, Bruce      Replacing diagnoses that were inactivated after the 08/01/22 regulatory import     Class 1 obesity with body mass index (BMI) of 31.0 to 31.9 in adult 04/23/2019   Cough    Insomnia  04/23/2019   Osteopenia after menopause 05/14/2019   05/10/2019 DEXA:  Results:               Lumbar spine L1-L4    Femoral neck (FN)      T-score    -1.8    RFN: -1.8  LFN: -1.7        Dx:Low Bone Density      FRAX 10-year fracture risk calculator: 4.3 % for any major fracture and 0.6 % for hip fracture.     Trigger finger of right thumb 03/11/2020    Past Surgical History:  Procedure Laterality Date   CESAREAN SECTION     myonectomy     NASAL SINUS SURGERY     SHOULDER ARTHROSCOPY W/ ROTATOR CUFF REPAIR      Current Medications: Current Meds  Medication Sig   acetaminophen (TYLENOL) 100 MG/ML solution Take 10 mg/kg by mouth every 4 (four) hours as needed for pain.   albuterol (VENTOLIN HFA) 108 (90 Base) MCG/ACT inhaler Inhale 2 puffs into the lungs every 6 (six) hours as needed for wheezing or shortness of breath.   budesonide-formoterol (SYMBICORT) 160-4.5 MCG/ACT inhaler INHALE 2 PUFFS INTO THE LUNGS TWICE A DAY   fluticasone (FLONASE) 50 MCG/ACT nasal spray Place 1 spray into both nostrils daily.   Multiple Vitamin (MULTIVITAMIN) tablet Take 1 tablet by mouth daily.   rosuvastatin (CRESTOR) 5 MG tablet Take 1 tablet (5 mg total) by mouth daily.     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  Tobacco Use   Smoking status: Never   Smokeless tobacco: Never  Vaping Use   Vaping status: Never Used  Substance and Sexual Activity   Alcohol use: Not Currently   Drug use: No   Sexual activity: Not on file  Other Topics Concern   Not on file  Social History Narrative   Married   1 son   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   Social Drivers  of Health   Financial Resource Strain: Low Risk  (01/31/2022)   Overall Financial Resource Strain (CARDIA)    Difficulty of Paying Living Expenses: Not hard at all  Food Insecurity: No Food Insecurity (01/31/2022)   Hunger Vital Sign    Worried About Running Out of Food in the Last Year: Never true    Ran Out of Food in the Last Year: Never true  Transportation Needs: No Transportation Needs (01/31/2022)   PRAPARE - Administrator, Civil Service (Medical): No    Lack of Transportation (Non-Medical): No  Physical Activity: Inactive (01/31/2022)   Exercise Vital Sign  Days of Exercise per Week: 0 days    Minutes of Exercise per Session: 0 min  Stress: No Stress Concern Present (01/31/2022)   [employer] of Occupational Health - Occupational Stress Questionnaire    Feeling of Stress : Not at all  Social Connections: Socially Integrated (01/09/2021)   Social Connection and Isolation Panel [NHANES]    Frequency of Communication with Friends and Family: More than three times a week    Frequency of Social Gatherings with Friends and Family: Three times a week    Attends Religious Services: More than 4 times per year    Active Member of Clubs or Organizations: Yes    Attends Engineer, structural: More than 4 times per year    Marital Status: Married     Family History: The patient's family history includes Diabetes in her father. There is no history of Colon cancer, Pancreatic cancer, Rectal cancer, or Stomach cancer. ROS:   Please see the history of present illness.    All 14 point review of systems negative except as described per history of present illness.  EKGs/Labs/Other Studies Reviewed:    The following studies were reviewed today:   EKG:       Recent Labs: 11/09/2022: Hemoglobin 12.6; Platelets 232 03/13/2023: ALT 20; BUN 14; Creatinine, Ser 0.76; Potassium 4.9; Sodium 144  Recent Lipid Panel    Component Value Date/Time   CHOL 230 (H) 03/13/2023  1511   TRIG 47 03/13/2023 1511   HDL 86 03/13/2023 1511   CHOLHDL 2.7 03/13/2023 1511   LDLCALC 136 (H) 03/13/2023 1511    Physical Exam:    VS:  BP 136/75 (BP Location: Right Arm, Patient Position: Sitting, Cuff Size: Normal)   Pulse 71   Ht 5\' 5"  (1.651 m)   Wt 182 lb (82.6 kg)   SpO2 96%   BMI 30.29 kg/m     Wt Readings from Last 3 Encounters:  04/24/23 182 lb (82.6 kg)  02/27/23 181 lb 0.6 oz (82.1 kg)  11/09/22 178 lb (80.7 kg)     GENERAL:  Well nourished, well developed in no acute distress CARDIAC: RRR, S1 and S2 present, no murmurs, no rubs, no gallops CHEST:  Clear to auscultation without rales, wheezing or rhonchi  Extremities: No pitting pedal edema. Pulses bilaterally symmetric with radial 2+ and dorsalis pedis 2+ NEUROLOGIC:  Alert and oriented x 3  Medication Adjustments/Labs and Tests Ordered: Current medicines are reviewed at length with the patient today.  Concerns regarding medicines are outlined above.  Orders Placed This Encounter  Procedures   Lipid panel   Comprehensive metabolic panel   No orders of the defined types were placed in this encounter.   Signed, Cecille Amsterdam, MD, MPH, [facility]. 04/24/2023 11:27 AM    [facility]

## 2023-04-24 NOTE — Assessment & Plan Note (Signed)
She has gained some weight since her last visit in 01/2022. She understands the benefits of wt loss as well as adverse effects of obesity. Consistency with healthy diet and physical activity encouraged. She prefers to hold on referral to healthy weight and wellness clinic.

## 2023-04-24 NOTE — Progress Notes (Signed)
ACUTE VISIT Chief Complaint  Patient presents with   Headache    Every few weeks, headache lasts for a few days, has been having some dizziness too   HPI: Kathryn Phillips is a 72 y.o. female with a PMHx significant for atherosclerosis of aorta, trigger finger of right thumb, chronic pain, and insomnia, among some, who is here today complaining of headaches.  Headaches: Patient complains of intermittent frontal-parietal headaches every few weeks for about 3 months. She describes it as an achy pain that can be sometimes severe at 10/10.  She has not identified exacerbating or alleviating factors.  When she has the headache, she frequently wakes up with a headache and it will last 2-3 days. The headaches have not significantly improved or worsened since they started.  She endorses some recent blurry vision, and mentions she has not seen her eye doctor recently.  She has been taking ibuprofen for the headaches.  She also takes Flonase as needed for allergies.   Negative for new medications.  Pertinent negatives include fever, nausea, vomiting, light sensitivity, numbness, tingling, nasal congestion, rhinorrhea, postnasal drainage, neck pain,or sore throat.   Dizziness:  Patient also complains of occasional dizziness when bending over for the last few weeks.  Episodes last less than 5 minutes. She doesn't believe the dizziness is related to her headaches. No associated CP, dyspnea, palpitation.  She denies dizziness while in bed, and hasn't had it at all for a week.  Lab Results  Component Value Date   NA 144 03/13/2023   CL 108 (H) 03/13/2023   K 4.9 03/13/2023   CO2 24 03/13/2023   BUN 14 03/13/2023   CREATININE 0.76 03/13/2023   EGFR 84 03/13/2023   CALCIUM 9.4 03/13/2023   ALBUMIN 4.3 03/13/2023   GLUCOSE 100 (H) 03/13/2023   Lab Results  Component Value Date   ALT 20 03/13/2023   AST 19 03/13/2023   ALKPHOS 101 03/13/2023   BILITOT 0.6 03/13/2023   Lab  Results  Component Value Date   WBC 5.2 11/09/2022   HGB 12.6 11/09/2022   HCT 37.0 11/09/2022   MCV 86.3 11/09/2022   PLT 232 11/09/2022   Insomnia/fatigue:  She also mentions she has had some sleeping difficulty for a long time, and sometimes wakes up feeling fatigued. She has not been told she is stopping breathing while sleeping. She has tried melatonin in the past for sleep, which helped.  Prediabetes:  Patient expresses some concern about her weight. She has gained 5 lbs since her last visit.  Exercise: She admits she has not been exercising lately.  Diet: She says she is trying to follow a healthy diet in general. She has eaten more sweets than usual over the last few months.  Negative for polydipsia, polyuria, polyphagia.  Lab Results  Component Value Date   HGBA1C 6.2 (H) 03/13/2023   Review of Systems  Constitutional:  Positive for fatigue. Negative for activity change, appetite change, chills and fever.  HENT:  Negative for ear pain, facial swelling, hearing loss, sinus pain and sore throat.   Respiratory:  Negative for cough and wheezing.   Cardiovascular:  Negative for leg swelling.  Endocrine: Negative for cold intolerance and heat intolerance.  Genitourinary:  Negative for decreased urine volume, frequency and hematuria.  Allergic/Immunologic: Positive for environmental allergies.  See other pertinent positives and negatives in HPI.  Current Outpatient Medications on File Prior to Visit  Medication Sig Dispense Refill   acetaminophen (TYLENOL) 100 MG/ML  solution Take 10 mg/kg by mouth every 4 (four) hours as needed for pain.     albuterol (VENTOLIN HFA) 108 (90 Base) MCG/ACT inhaler Inhale 2 puffs into the lungs every 6 (six) hours as needed for wheezing or shortness of breath.     budesonide-formoterol (SYMBICORT) 160-4.5 MCG/ACT inhaler INHALE 2 PUFFS INTO THE LUNGS TWICE A DAY 10.2 each 3   fluticasone (FLONASE) 50 MCG/ACT nasal spray Place 1 spray into both  nostrils daily. 16 g 1   Multiple Vitamin (MULTIVITAMIN) tablet Take 1 tablet by mouth daily.     rosuvastatin (CRESTOR) 5 MG tablet Take 1 tablet (5 mg total) by mouth daily. 90 tablet 3   No current facility-administered medications on file prior to visit.   Past Medical History:  Diagnosis Date   Allergic rhinitis 07/19/2010   ARM PAIN, LEFT 11/07/2007   Qualifier: Diagnosis of   By: Cato Mulligan MD, Bruce         Atherosclerosis of aorta (HCC) 03/08/2022   Seen on chest CT on 03/04/22.     BACK PAIN, UPPER 02/18/2009   Qualifier: Diagnosis of   By: Caryl Never MD, Bruce      Replacing diagnoses that were inactivated after the 08/01/22 regulatory import     Class 1 obesity with body mass index (BMI) of 31.0 to 31.9 in adult 04/23/2019   Cough    Insomnia 04/23/2019   Osteopenia after menopause 05/14/2019   05/10/2019 DEXA:  Results:               Lumbar spine L1-L4    Femoral neck (FN)      T-score    -1.8    RFN: -1.8  LFN: -1.7        Dx:Low Bone Density      FRAX 10-year fracture risk calculator: 4.3 % for any major fracture and 0.6 % for hip fracture.     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  Tobacco Use   Smoking status: Never   Smokeless tobacco: Never  Vaping Use   Vaping status: Never Used  Substance and Sexual Activity   Alcohol use: Not Currently   Drug use: No   Sexual activity: Not on file  Other Topics Concern   Not on file  Social History Narrative   Married   1 son   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   Social Drivers of Health    Financial Resource Strain: Low Risk  (01/31/2022)   Overall Financial Resource Strain (CARDIA)    Difficulty of Paying Living Expenses: Not hard at all  Food Insecurity: No Food Insecurity (01/31/2022)   Hunger Vital Sign    Worried About Running Out of Food in the Last Year: Never true    Ran Out of Food in the Last Year: Never true  Transportation Needs: No Transportation Needs (01/31/2022)   PRAPARE - Administrator, Civil Service (Medical): No    Lack of Transportation (Non-Medical): No  Physical Activity: Inactive (01/31/2022)   Exercise Vital Sign  Days of Exercise per Week: 0 days    Minutes of Exercise per Session: 0 min  Stress: No Stress Concern Present (01/31/2022)   [employer] of Occupational Health - Occupational Stress Questionnaire    Feeling of Stress : Not at all  Social Connections: Socially Integrated (01/09/2021)   Social Connection and Isolation Panel [NHANES]    Frequency of Communication with Friends and Family: More than three times a week    Frequency of Social Gatherings with Friends and Family: Three times a week    Attends Religious Services: More than 4 times per year    Active Member of Clubs or Organizations: Yes    Attends Banker Meetings: More than 4 times per year    Marital Status: Married   Vitals:   04/24/23 1504  BP: 120/78  Pulse: 79  Resp: 16  SpO2: 97%   Body mass index is 30.31 kg/m.  Physical Exam Vitals and nursing note reviewed.  Constitutional:      General: She is not in acute distress.    Appearance: She is well-developed.  HENT:     Head: Normocephalic and atraumatic.     Nose: No rhinorrhea.     Right Turbinates: Enlarged.     Left Turbinates: Enlarged.     Right Sinus: No maxillary sinus tenderness or frontal sinus tenderness.     Left Sinus: No maxillary sinus tenderness or frontal sinus tenderness.     Mouth/Throat:     Mouth: Mucous membranes are moist.     Pharynx: Uvula midline.  Postnasal drip present.  Eyes:     Conjunctiva/sclera: Conjunctivae normal.     Pupils: Pupils are equal, round, and reactive to light.     Funduscopic exam:    Right eye: No hemorrhage or papilledema.        Left eye: No hemorrhage or papilledema.  Cardiovascular:     Rate and Rhythm: Normal rate and regular rhythm.     Heart sounds: No murmur heard. Pulmonary:     Effort: Pulmonary effort is normal. No respiratory distress.     Breath sounds: Normal breath sounds.  Abdominal:     Palpations: Abdomen is soft. There is no mass.     Tenderness: There is no abdominal tenderness.  Musculoskeletal:     Cervical back: No tenderness or bony tenderness. No pain with movement or muscular tenderness. Decreased range of motion.  Lymphadenopathy:     Cervical: No cervical adenopathy.  Skin:    General: Skin is warm.     Findings: No erythema or rash.  Neurological:     General: No focal deficit present.     Mental Status: She is alert and oriented to person, place, and time.     Cranial Nerves: No cranial nerve deficit.     Gait: Gait normal.     Deep Tendon Reflexes:     Reflex Scores:      Bicep reflexes are 2+ on the right side and 2+ on the left side.      Patellar reflexes are 2+ on the right side and 2+ on the left side. Psychiatric:        Mood and Affect: Mood and affect normal.    ASSESSMENT AND PLAN:  Ms. Donan was seen today for headaches and vaccines.   Insomnia, unspecified type Assessment & Plan: Chronic. This problem could be contributing to headaches. After discussion of some treatment options, she agrees with trying Nortriptyline 10 mg 30-minute before  bedtime. We discussed some side effects. Also recommend improving sleep hygiene. Follow-up in 6 weeks.  Headache, unspecified headache type We discussed possible causes, he history and examination do not suggest a serious process. She prefers to hold on head imaging. Recommend arranging eye exam. After  discussion of some side effects, she agrees with trial of nortriptyline 10 mg daily at bedtime to treat possible tension headache. She was clearly instructed about warning signs. Start a headache diary. Follow-up in 6 weeks, before if needed.  -     Nortriptyline HCl; Take 1 capsule (10 mg total) by mouth at bedtime.  Dispense: 30 capsule; Refill: 1  Prediabetes Assessment & Plan: Last hemoglobin A1c 6.2 in 03/2023. Encourage consistency with a healthy lifestyle for diabetes prevention.  Need for influenza vaccination -     Flu Vaccine Trivalent High Dose (Fluad)  Class 1 obesity with serious comorbidity and body mass index (BMI) of 30.0 to 30.9 in adult, unspecified obesity type Assessment & Plan: She has gained some weight since her last visit in 01/2022. She understands the benefits of wt loss as well as adverse effects of obesity. Consistency with healthy diet and physical activity encouraged. She prefers to hold on referral to healthy weight and wellness clinic.  Return in about 6 weeks (around 06/05/2023) for Headache.  I, Rolla Etienne Wierda, acting as a scribe for Shellia Hartl [place], MD., have documented all relevant documentation on the behalf of Devony Mcgrady [place], MD, as directed by  Jenavee Laguardia [place], MD while in the presence of Imojean Yoshino [place], MD.   I, Norman Bier [place], MD, have reviewed all documentation for this visit. The documentation on 04/24/23 for the exam, diagnosis, procedures, and orders are all accurate and complete.  Mikaela Hilgeman G. [place], MD  [facility]. [place] office.

## 2023-04-24 NOTE — Assessment & Plan Note (Signed)
Brief episodes of lightheadedness on postural changes occurring over the last few weeks.  Symptoms appear to have improved over the past week. Likely precipitated by dehydration.  Encouraged adequate hydration throughout the day and caution changing posture from supine to sitting or sitting to standing or bending down and standing up.

## 2023-04-24 NOTE — Patient Instructions (Signed)
Medication Instructions:  Your physician recommends that you continue on your current medications as directed. Please refer to the Current Medication list given to you today.  *If you need a refill on your cardiac medications before your next appointment, please call your pharmacy*   Lab Work: Your physician recommends that you return for lab work in 3 weeks. You need to have labs done when you are fasting.  You can come Monday through Friday 8:30 am to 11:30 am and 1:15 to 4:00. You do not need to make an appointment as the order has already been placed. You will have a fasting lipid panel and a complete metabolic panel  If you have labs (blood work) drawn today and your tests are completely normal, you will receive your results only by: MyChart Message (if you have MyChart) OR A paper copy in the mail If you have any lab test that is abnormal or we need to change your treatment, we will call you to review the results.   Testing/Procedures: None Ordered   Follow-Up: At [facility], you and your health needs are our priority.  As part of our continuing mission to provide you with exceptional heart care, we have created designated Provider Care Teams.  These Care Teams include your primary Cardiologist (physician) and Advanced Practice Providers (APPs -  Physician Assistants and Nurse Practitioners) who all work together to provide you with the care you need, when you need it.  We recommend signing up for the patient portal called "MyChart".  Sign up information is provided on this After Visit Summary.  MyChart is used to connect with patients for Virtual Visits (Telemedicine).  Patients are able to view lab/test results, encounter notes, upcoming appointments, etc.  Non-urgent messages can be sent to your provider as well.   To learn more about what you can do with MyChart, go to [email or web address].    Your next appointment:   1 year follow up with Dr.Madireddy

## 2023-05-08 ENCOUNTER — Other Ambulatory Visit: Payer: Self-pay

## 2023-05-08 ENCOUNTER — Telehealth: Payer: Self-pay

## 2023-05-08 MED ORDER — ASPIRIN 81 MG PO TBEC
81.0000 mg | DELAYED_RELEASE_TABLET | Freq: Every day | ORAL | 3 refills | Status: AC
Start: 2023-05-08 — End: ?

## 2023-05-08 NOTE — Telephone Encounter (Signed)
 Called patient and she reported that she was having chest pain and pressure in the center of her chest since last Saturday until this morning. This morning she took one of her antispasmodic medications and she has not had chest pain since taking this medication. She states that she has no other symptoms except for the chest pain/ pressure. Spoke to Dr. Liborio regarding this patient's symptoms and after reviewing the chart he recommended that the patient start an aspirin  81 mg daily and if the symptoms occurred again to contact the office or go to the ER. Dr. Madireddy's recommendation was relayed to the patient and she verbalized understanding and had no further questions at this time.

## 2023-05-08 NOTE — Telephone Encounter (Signed)
 Pt c/o of Chest Pain: STAT if CP now or developed within 24 hours  1. Are you having CP right now? No   2. Are you experiencing any other symptoms (ex. SOB, nausea, vomiting, sweating)? No   3. How long have you been experiencing CP? Since Saturday 01/04  4. Is your CP continuous or coming and going? Steady   5. Have you taken Nitroglycerin ? No   Patient is requesting call back to go over medication and mentioned that she had chest pain from Saturday until this morning and it has since gone away, but would like to discuss medications.   ?

## 2023-05-11 ENCOUNTER — Encounter: Payer: Federal, State, Local not specified - PPO | Admitting: Family Medicine

## 2023-06-15 ENCOUNTER — Encounter (INDEPENDENT_AMBULATORY_CARE_PROVIDER_SITE_OTHER): Payer: Medicare Other | Admitting: Family Medicine

## 2023-06-15 NOTE — Progress Notes (Signed)
Erroneous encounter

## 2023-07-07 ENCOUNTER — Other Ambulatory Visit: Payer: Self-pay | Admitting: Family Medicine

## 2023-07-07 DIAGNOSIS — Z1231 Encounter for screening mammogram for malignant neoplasm of breast: Secondary | ICD-10-CM

## 2023-07-25 ENCOUNTER — Ambulatory Visit
Admission: RE | Admit: 2023-07-25 | Discharge: 2023-07-25 | Disposition: A | Discharge status: Home / Self Care | Source: Ambulatory Visit | Attending: Family Medicine | Admitting: Family Medicine

## 2023-07-25 DIAGNOSIS — Z1231 Encounter for screening mammogram for malignant neoplasm of breast: Secondary | ICD-10-CM

## 2023-09-07 ENCOUNTER — Other Ambulatory Visit: Payer: Self-pay

## 2023-09-07 ENCOUNTER — Encounter (HOSPITAL_BASED_OUTPATIENT_CLINIC_OR_DEPARTMENT_OTHER): Payer: Self-pay | Admitting: Emergency Medicine

## 2023-09-07 ENCOUNTER — Emergency Department (HOSPITAL_BASED_OUTPATIENT_CLINIC_OR_DEPARTMENT_OTHER)
Admission: EM | Admit: 2023-09-07 | Discharge: 2023-09-07 | Disposition: A | Discharge status: Home / Self Care | Attending: Emergency Medicine | Admitting: Emergency Medicine

## 2023-09-07 ENCOUNTER — Emergency Department (HOSPITAL_BASED_OUTPATIENT_CLINIC_OR_DEPARTMENT_OTHER)

## 2023-09-07 DIAGNOSIS — Z7982 Long term (current) use of aspirin: Secondary | ICD-10-CM | POA: Insufficient documentation

## 2023-09-07 DIAGNOSIS — M79602 Pain in left arm: Secondary | ICD-10-CM | POA: Diagnosis present

## 2023-09-07 DIAGNOSIS — M5412 Radiculopathy, cervical region: Secondary | ICD-10-CM | POA: Insufficient documentation

## 2023-09-07 DIAGNOSIS — R519 Headache, unspecified: Secondary | ICD-10-CM | POA: Insufficient documentation

## 2023-09-07 LAB — BASIC METABOLIC PANEL WITH GFR
Anion gap: 12 (ref 5–15)
BUN: 12 mg/dL (ref 8–23)
CO2: 23 mmol/L (ref 22–32)
Calcium: 9.5 mg/dL (ref 8.9–10.3)
Chloride: 106 mmol/L (ref 98–111)
Creatinine, Ser: 0.74 mg/dL (ref 0.44–1.00)
GFR, Estimated: >60 mL/min (ref >60)
Glucose, Bld: 98 mg/dL (ref 70–99)
Potassium: 4 mmol/L (ref 3.5–5.1)
Sodium: 141 mmol/L (ref 135–145)

## 2023-09-07 LAB — CBC WITH DIFFERENTIAL/PLATELET
Abs Immature Granulocytes: 0 10*3/uL (ref 0.00–0.07)
Basophils Absolute: 0 10*3/uL (ref 0.0–0.1)
Basophils Relative: 1 %
Eosinophils Absolute: 0.2 10*3/uL (ref 0.0–0.5)
Eosinophils Relative: 4 %
HCT: 36.3 % (ref 36.0–46.0)
Hemoglobin: 12.1 g/dL (ref 12.0–15.0)
Immature Granulocytes: 0 %
Lymphocytes Relative: 51 %
Lymphs Abs: 2.2 10*3/uL (ref 0.7–4.0)
MCH: 28.3 pg (ref 26.0–34.0)
MCHC: 33.3 g/dL (ref 30.0–36.0)
MCV: 84.8 fL (ref 80.0–100.0)
Monocytes Absolute: 0.4 10*3/uL (ref 0.1–1.0)
Monocytes Relative: 9 %
Neutro Abs: 1.5 10*3/uL — ABNORMAL LOW (ref 1.7–7.7)
Neutrophils Relative %: 35 %
Platelets: 236 10*3/uL (ref 150–400)
RBC: 4.28 MIL/uL (ref 3.87–5.11)
RDW: 13.2 % (ref 11.5–15.5)
WBC: 4.2 10*3/uL (ref 4.0–10.5)
nRBC: 0 % (ref 0.0–0.2)

## 2023-09-07 LAB — MAGNESIUM: Magnesium: 1.8 mg/dL (ref 1.7–2.4)

## 2023-09-07 MED ORDER — PREDNISONE 10 MG (21) PO TBPK: ORAL_TABLET | Freq: Every day | ORAL | 0 refills | Status: DC

## 2023-09-07 MED ORDER — TRAMADOL HCL 50 MG PO TABS
50.0000 mg | ORAL_TABLET | Freq: Four times a day (QID) | ORAL | 0 refills | Status: AC | PRN
Start: 2023-09-07 — End: ?

## 2023-09-07 MED ORDER — TRAMADOL HCL 50 MG PO TABS
50.0000 mg | ORAL_TABLET | Freq: Once | ORAL | Status: AC
  Administered 2023-09-07: 50 mg via ORAL
  Filled 2023-09-07: qty 1

## 2023-09-07 MED ORDER — PREDNISONE 50 MG PO TABS
60.0000 mg | ORAL_TABLET | Freq: Once | ORAL | Status: AC
  Administered 2023-09-07: 60 mg via ORAL
  Filled 2023-09-07: qty 1

## 2023-09-07 NOTE — ED Provider Notes (Signed)
 [facility] Provider Note   CSN: [id number] Arrival date & time: 09/07/23  2024     History  Chief Complaint  Patient presents with   Arm Pain    Kathryn Phillips is a 73 y.o. female.  She is here with a sharp deep pain in her left bicep area it has been there for 3 weeks.  Worse with movement.  Sometimes runs down to her wrist.  She is also noticed some tingling sensation in her left thigh.  She had prior cervical surgery remote.  She has also had rotator cuff surgery.  No known trauma.  Has been trying Tylenol  without improvement.  The history is provided by the patient.  Arm Pain This is a new problem. The problem occurs constantly. The problem has not changed since onset.Pertinent negatives include no chest pain, no abdominal pain, no headaches and no shortness of breath. The symptoms are aggravated by bending and twisting. Nothing relieves the symptoms. She has tried acetaminophen  for the symptoms. The treatment provided no relief.       Home Medications Prior to Admission medications   Medication Sig Start Date End Date Taking? Authorizing Provider  acetaminophen  (TYLENOL ) 100 MG/ML solution Take 10 mg/kg by mouth every 4 (four) hours as needed for pain.    [provider]  albuterol  (VENTOLIN  HFA) 108 (90 Base) MCG/ACT inhaler Inhale 2 puffs into the lungs every 6 (six) hours as needed for wheezing or shortness of breath.    [provider]  aspirin  EC 81 MG tablet Take 1 tablet (81 mg total) by mouth daily. Swallow whole. 05/08/23   Madireddy, Daymon Evans, MD  budesonide -formoterol  (SYMBICORT ) 160-4.5 MCG/ACT inhaler INHALE 2 PUFFS INTO THE LUNGS TWICE A DAY 02/01/22   [place], Betty G, MD  fluticasone  (FLONASE ) 50 MCG/ACT nasal spray Place 1 spray into both nostrils daily. 12/24/21   Viola Greulich, MD  Multiple Vitamin (MULTIVITAMIN) tablet Take 1 tablet by mouth daily.    [provider]  nortriptyline   (PAMELOR ) 10 MG capsule Take 1 capsule (10 mg total) by mouth at bedtime. 04/24/23   [place], Betty G, MD  rosuvastatin  (CRESTOR ) 5 MG tablet Take 1 tablet (5 mg total) by mouth daily. 03/21/23   Madireddy, Daymon Evans, MD      Allergies    Hydrocodone     Review of Systems   Review of Systems  Constitutional:  Negative for fever.  Eyes:  Negative for visual disturbance.  Respiratory:  Negative for shortness of breath.   Cardiovascular:  Negative for chest pain.  Gastrointestinal:  Negative for abdominal pain.  Neurological:  Positive for numbness. Negative for weakness and headaches.    Physical Exam Updated Vital Signs BP 110/80 (BP Location: Right Arm)   Pulse 81   Temp 98.3 F (36.8 C) (Oral)   Resp 18   Ht 5\' 5"  (1.651 m)   Wt 79.4 kg   SpO2 97%   BMI 29.12 kg/m  Physical Exam Vitals and nursing note reviewed.  Constitutional:      General: She is not in acute distress.    Appearance: Normal appearance. She is well-developed.  HENT:     Head: Normocephalic and atraumatic.  Eyes:     Conjunctiva/sclera: Conjunctivae normal.  Cardiovascular:     Rate and Rhythm: Normal rate and regular rhythm.     Heart sounds: No murmur heard. Pulmonary:     Effort: Pulmonary effort is normal. No respiratory distress.  Breath sounds: Normal breath sounds. No stridor. No wheezing.  Abdominal:     Palpations: Abdomen is soft.     Tenderness: There is no abdominal tenderness. There is no guarding or rebound.  Musculoskeletal:        General: Tenderness present. No swelling or deformity.     Cervical back: Neck supple.     Comments: She has no specific tenderness around her left shoulder.  She is tender in her biceps and with flexion of her arm.  Less pain with supination and pronation and no distal forearm pain.  Radial pulse 2+.  Sensory and motor intact.  Skin:    General: Skin is warm and dry.  Neurological:     General: No focal deficit present.     Mental Status: She is  alert.     GCS: GCS eye subscore is 4. GCS verbal subscore is 5. GCS motor subscore is 6.     Sensory: No sensory deficit.     Motor: No weakness.     Gait: Gait normal.     ED Results / Procedures / Treatments   Labs (all labs ordered are listed, but only abnormal results are displayed) Labs Reviewed  CBC WITH DIFFERENTIAL/PLATELET - Abnormal; Notable for the following components:      Result Value   Neutro Abs 1.5 (*)    All other components within normal limits  BASIC METABOLIC PANEL WITH GFR  MAGNESIUM    EKG None  Radiology CT Head Wo Contrast Result Date: 09/07/2023 CLINICAL DATA:  Left arm pain and headaches for several days, initial encounter EXAM: CT HEAD WITHOUT CONTRAST CT CERVICAL SPINE WITHOUT CONTRAST TECHNIQUE: Multidetector CT imaging of the head and cervical spine was performed following the standard protocol without intravenous contrast. Multiplanar CT image reconstructions of the cervical spine were also generated. RADIATION DOSE REDUCTION: This exam was performed according to the departmental dose-optimization program which includes automated exposure control, adjustment of the mA and/or kV according to patient size and/or use of iterative reconstruction technique. COMPARISON:  None Available. FINDINGS: CT HEAD FINDINGS Brain: No evidence of acute infarction, hemorrhage, hydrocephalus, extra-axial collection or mass lesion/mass effect. Mild atrophic changes are noted. Vascular: No hyperdense vessel or unexpected calcification. Skull: Normal. Negative for fracture or focal lesion. Sinuses/Orbits: No acute finding. Other: None. CT CERVICAL SPINE FINDINGS Alignment: Straightening of the normal cervical lordosis is noted. Skull base and vertebrae: 7 cervical segments are well visualized. Vertebral body height is well maintained. Changes of prior fusion from C4 to C7 are seen with anterior fixation. No acute fracture or acute facet abnormality is noted. Mild facet hypertrophic  changes and osteophytic changes are seen. The odontoid is within normal limits. Soft tissues and spinal canal: Surrounding soft tissue structures are within normal limits. Upper chest: Visualized lung apices are within normal limits. Other: None IMPRESSION: CT of the head: Mild atrophic changes are noted. CT of the cervical spine: Multilevel postsurgical and degenerative changes are seen without acute abnormality. Electronically Signed   By: Violeta Grey M.D.   On: 09/07/2023 21:25   CT Cervical Spine Wo Contrast Result Date: 09/07/2023 CLINICAL DATA:  Left arm pain and headaches for several days, initial encounter EXAM: CT HEAD WITHOUT CONTRAST CT CERVICAL SPINE WITHOUT CONTRAST TECHNIQUE: Multidetector CT imaging of the head and cervical spine was performed following the standard protocol without intravenous contrast. Multiplanar CT image reconstructions of the cervical spine were also generated. RADIATION DOSE REDUCTION: This exam was performed according to the  departmental dose-optimization program which includes automated exposure control, adjustment of the mA and/or kV according to patient size and/or use of iterative reconstruction technique. COMPARISON:  None Available. FINDINGS: CT HEAD FINDINGS Brain: No evidence of acute infarction, hemorrhage, hydrocephalus, extra-axial collection or mass lesion/mass effect. Mild atrophic changes are noted. Vascular: No hyperdense vessel or unexpected calcification. Skull: Normal. Negative for fracture or focal lesion. Sinuses/Orbits: No acute finding. Other: None. CT CERVICAL SPINE FINDINGS Alignment: Straightening of the normal cervical lordosis is noted. Skull base and vertebrae: 7 cervical segments are well visualized. Vertebral body height is well maintained. Changes of prior fusion from C4 to C7 are seen with anterior fixation. No acute fracture or acute facet abnormality is noted. Mild facet hypertrophic changes and osteophytic changes are seen. The odontoid is  within normal limits. Soft tissues and spinal canal: Surrounding soft tissue structures are within normal limits. Upper chest: Visualized lung apices are within normal limits. Other: None IMPRESSION: CT of the head: Mild atrophic changes are noted. CT of the cervical spine: Multilevel postsurgical and degenerative changes are seen without acute abnormality. Electronically Signed   By: Violeta Grey M.D.   On: 09/07/2023 21:25    Procedures Procedures    Medications Ordered in ED Medications - No data to display  ED Course/ Medical Decision Making/ A&P                                 Medical Decision Making Risk Prescription drug management.   This patient complains of left upper arm pain, left thigh tingling; this involves an extensive number of treatment Options and is a complaint that carries with it a high risk of complications and morbidity. The differential includes stroke, peripheral neuropathy, compartment syndrome, DVT, musculoskeletal strain  I ordered, reviewed and interpreted labs, which included CBC normal chemistries normal I ordered medication oral steroids and pain medicine and reviewed PMP when indicated. I ordered imaging studies which included CT head and cervical spine and I independently    visualized and interpreted imaging which showed no acute stroke, does have degenerative changes Additional history obtained from patient's husband Previous records obtained and reviewed in epic including recent cardiology and PCP notes Cardiac monitoring reviewed, sinus rhythm Social determinants considered, physically inactive Critical Interventions: None  After the interventions stated above, I reevaluated the patient and found patient to be well-appearing in no distress neuro intact Admission and further testing considered, no indications for further workup or admission at this time.  She is comfortable plan for trial of steroids and pain medication.  She will also try  anti-inflammatories.  Recommended close follow-up with PCP and her spine team.  Return instructions discussed.  Discussed she will possibly need an MRI that her PCP can set her up with.         Final Clinical Impression(s) / ED Diagnoses Final diagnoses:  Left arm pain  Cervical radicular pain    Rx / DC Orders ED Discharge Orders          Ordered    predniSONE  (STERAPRED UNI-PAK 21 TAB) 10 MG (21) TBPK tablet  Daily        09/07/23 2149    traMADol  (ULTRAM ) 50 MG tablet  Every 6 hours PRN        09/07/23 2149              Tonya Fredrickson, MD 09/08/23 1020

## 2023-09-07 NOTE — Discharge Instructions (Addendum)
 You are seen in the emergency department for 3 weeks of left upper arm pain and left thigh numbness.  Your CAT scan showed some arthritic changes in your neck.  You may have a pinched nerve.  We are trialing you on some tramadol for pain and steroids for inflammation.  Please contact your primary care doctor as you may ultimately need an MRI.  Return to the emergency department if any worsening or concerning symptoms

## 2023-09-07 NOTE — ED Triage Notes (Addendum)
 Patient reports left upper arm pain with tingling sensation that goes down left arm and left leg associated with headache for 3 days. No known injury. Limited ROM.

## 2023-09-29 ENCOUNTER — Ambulatory Visit (INDEPENDENT_AMBULATORY_CARE_PROVIDER_SITE_OTHER): Admitting: Family Medicine

## 2023-09-29 ENCOUNTER — Encounter: Payer: Self-pay | Admitting: Family Medicine

## 2023-09-29 VITALS — BP 120/76 | HR 80 | Temp 98.4°F | Resp 16 | Ht 65.0 in | Wt 188.4 lb

## 2023-09-29 DIAGNOSIS — M7542 Impingement syndrome of left shoulder: Secondary | ICD-10-CM | POA: Diagnosis not present

## 2023-09-29 DIAGNOSIS — R200 Anesthesia of skin: Secondary | ICD-10-CM

## 2023-09-29 DIAGNOSIS — M25512 Pain in left shoulder: Secondary | ICD-10-CM | POA: Diagnosis not present

## 2023-09-29 MED ORDER — GABAPENTIN 300 MG PO CAPS: 300.0000 mg | ORAL_CAPSULE | Freq: Every day | ORAL | 1 refills | Status: AC

## 2023-09-29 NOTE — Progress Notes (Signed)
 ACUTE VISIT Chief Complaint  Patient presents with   Hospitalization Follow-up    Pt reports her L arm pain is coming back but not as severe. Took prednisone . Tingling. Pain radiates down to leg. No pain at the moment.    HPI: Kathryn Phillips is a 73 y.o. female with a PMHx significant for atherosclerosis of aorta, chronic pain, HLD, and insomnia here today for ED follow up.   Patient was seen in the ED on 09/07/2023 for constant left arm pain and LLE  numbness and tingling. She was given a course of prednisone  which helped with her leg. Symptoms started about 3 weeks prior to the ED visit.   Currently, her arm pain is returning and runs down her arm to her elbow and occasionally her wrist.  Arm pain is less severe than when she went to the ED. She rates it as a 5/10 at this time but says it was so severe she could not raise her arm initially.  Also noticing some returning numbness, tingling, and "freezing sensation" down the side of her left leg to her knee about 2-3 days after completing Prednisone  treatment. She says these episodes of her legs are not lasting as long as they have in the past, but does rate them as an 8/10 when having symptoms.  She was prescribed tramadol  50 mg every 6 hours as needed.  Pertinent negatives include neck pain, headache, numbness or tingling in her arm, known shoulder injury, fever, chills, night sweats, or abnormal weight loss.   Not currently following with orthopedics, but she has in the past for rotator cuff problems in right shoulder.   CT Head and Cervical Spine from 09/07/2023 Impression:  CT of the head: Mild atrophic changes are noted.  CT of the cervical spine: Multilevel postsurgical and degenerative changes are seen without acute abnormality.   Review of Systems  Constitutional:  Negative for appetite change and chills.  HENT:  Negative for mouth sores and sore throat.   Respiratory:  Negative for cough, shortness of breath and wheezing.    Cardiovascular:  Negative for chest pain.  Gastrointestinal:  Negative for abdominal pain, nausea and vomiting.  Genitourinary:  Negative for decreased urine volume, dysuria and hematuria.  Skin:  Negative for rash.  Neurological:  Negative for syncope, weakness and headaches.  See other pertinent positives and negatives in HPI.  Current Outpatient Medications on File Prior to Visit  Medication Sig Dispense Refill   acetaminophen  (TYLENOL ) 100 MG/ML solution Take 10 mg/kg by mouth every 4 (four) hours as needed for pain.     albuterol  (VENTOLIN  HFA) 108 (90 Base) MCG/ACT inhaler Inhale 2 puffs into the lungs every 6 (six) hours as needed for wheezing or shortness of breath.     aspirin  EC 81 MG tablet Take 1 tablet (81 mg total) by mouth daily. Swallow whole. 90 tablet 3   fluticasone  (FLONASE ) 50 MCG/ACT nasal spray Place 1 spray into both nostrils daily. 16 g 1   Multiple Vitamin (MULTIVITAMIN) tablet Take 1 tablet by mouth daily.     rosuvastatin  (CRESTOR ) 5 MG tablet Take 1 tablet (5 mg total) by mouth daily. 90 tablet 3   traMADol  (ULTRAM ) 50 MG tablet Take 1 tablet (50 mg total) by mouth every 6 (six) hours as needed. 15 tablet 0   budesonide -formoterol  (SYMBICORT ) 160-4.5 MCG/ACT inhaler INHALE 2 PUFFS INTO THE LUNGS TWICE A DAY (Patient not taking: Reported on 09/29/2023) 10.2 each 3   No current facility-administered  medications on file prior to visit.   Past Medical History:  Diagnosis Date   Allergic rhinitis 07/19/2010   ARM PAIN, LEFT 11/07/2007   Qualifier: Diagnosis of   By: Penney Bowling MD, Bruce         Atherosclerosis of aorta (HCC) 03/08/2022   Seen on chest CT on 03/04/22.     BACK PAIN, UPPER 02/18/2009   Qualifier: Diagnosis of   By: Darren Em MD, Bruce      Replacing diagnoses that were inactivated after the 08/01/22 regulatory import     Class 1 obesity with body mass index (BMI) of 31.0 to 31.9 in adult 04/23/2019   Cough    Insomnia 04/23/2019   Osteopenia after  menopause 05/14/2019   05/10/2019 DEXA:  Results:               Lumbar spine L1-L4    Femoral neck (FN)      T-score    -1.8    RFN: -1.8  LFN: -1.7        Dx:Low Bone Density      FRAX 10-year fracture risk calculator: 4.3 % for any major fracture and 0.6 % for hip fracture.     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  Tobacco Use   Smoking status: Never   Smokeless tobacco: Never  Vaping Use   Vaping status: Never Used  Substance and Sexual Activity   Alcohol use: Not Currently   Drug use: No   Sexual activity: Not on file  Other Topics Concern   Not on file  Social History Narrative   Married   1 son   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   Social Drivers of Health   Financial Resource Strain: Low Risk  (01/31/2022)   Overall Financial Resource Strain (CARDIA)    Difficulty of Paying Living Expenses: Not hard at all  Food Insecurity: No Food Insecurity (01/31/2022)   Hunger Vital Sign    Worried About Running Out of Food in the Last Year: Never true    Ran Out of Food in the Last Year: Never true  Transportation Needs: No Transportation Needs (01/31/2022)   PRAPARE - Administrator, Civil Service (Medical): No    Lack of Transportation (Non-Medical): No  Physical Activity: Inactive (01/31/2022)   Exercise Vital Sign    Days of Exercise per Week: 0 days    Minutes of Exercise per Session: 0 min  Stress: No Stress Concern Present (01/31/2022)   [employer] of Occupational Health - Occupational Stress Questionnaire    Feeling of Stress :  Not at all  Social Connections: Socially Integrated (01/09/2021)   Social Connection and Isolation Panel [NHANES]    Frequency of Communication with Friends and Family: More than three times a week    Frequency of Social Gatherings with Friends and Family: Three times a week    Attends Religious Services: More than 4 times per year    Active Member of Clubs or Organizations: Yes    Attends Banker  Meetings: More than 4 times per year    Marital Status: Married   Vitals:   09/29/23 1121  BP: 120/76  Pulse: 80  Resp: 16  Temp: 98.4 F (36.9 C)  SpO2: 97%   Body mass index is 31.35 kg/m.  Physical Exam Vitals and nursing note reviewed.  Constitutional:      General: She is not in acute distress.    Appearance: She is well-developed.  HENT:     Head: Normocephalic and atraumatic.  Eyes:     Conjunctiva/sclera: Conjunctivae normal.  Cardiovascular:     Rate and Rhythm: Normal rate and regular rhythm.     Pulses:          Dorsalis pedis pulses are 2+ on the right side and 2+ on the left side.     Heart sounds: No murmur heard. Pulmonary:     Effort: Pulmonary effort is normal. No respiratory distress.     Breath sounds: Normal breath sounds.  Abdominal:     Palpations: Abdomen is soft. There is no hepatomegaly or mass.     Tenderness: There is no abdominal tenderness.  Musculoskeletal:     Left shoulder: Tenderness present.     Cervical back: No muscular tenderness.     Lumbar back: No tenderness. Negative right straight leg raise test and negative left straight leg raise test.     Right lower leg: No edema.     Left lower leg: No edema.     Comments: Left shoulder: No tenderness with palpation but with movement. Zoila Hines' test pos, drop arm rotator cuff test neg, empty can supraspinatus test elicits mild pain, cross body adduction test negative, lift-Off Subscapularis test positive. ROM with no significant limitation, active abduction.  Lymphadenopathy:      Cervical: No cervical adenopathy.  Skin:    General: Skin is warm.     Findings: No erythema or rash.  Neurological:     General: No focal deficit present.     Mental Status: She is alert and oriented to person, place, and time.     Cranial Nerves: No cranial nerve deficit.     Gait: Gait normal.  Psychiatric:        Mood and Affect: Mood and affect normal.   ASSESSMENT AND PLAN:  Ms. Schimek was seen today for hospital follow up.   Left shoulder pain, unspecified chronicity Problem has been going on for about 6 weeks. We discussed possible etiologies. OA, rotator cuff ,and radicular. I do not think imaging is needed at this time. She agrees with trying PT.  -     Ambulatory referral to Physical Therapy  Impingement syndrome of left shoulder Examination is suggestive of rotator cuff pathology. PT will be arranged.  Lower extremity numbness Going on for 6 weeks, improved while on Prednisone  and symptoms returned after completing treatment. Lumbar MRI is warrant. We discussed treatment options while waiting to see ortho, agrees with trying Gabapentin  300 mg at bedtime.  -     MR LUMBAR SPINE WO CONTRAST; Future -     Ambulatory referral to Orthopedics -     Gabapentin ; Take 1 capsule (300 mg total) by mouth at bedtime.  Dispense: 30 capsule; Refill: 1  Return in about 5 months (around 02/29/2024) for CPE.  I, Odelia Bender, acting as a scribe for Jerremy Maione [place], MD., have documented all relevant documentation on the behalf of Selmer Adduci [place], MD, as directed by  Cleston Lautner [place], MD while in the presence of  Melanny Wire [place], MD.   I, Konnar Ben [place], MD, have reviewed all documentation for this visit. The documentation on 09/30/23 for the exam, diagnosis, procedures, and orders are all accurate and complete.  Divina Neale G. [place], MD  [facility] [place] office.

## 2023-09-29 NOTE — Patient Instructions (Addendum)
 A few things to remember from today's visit:  Lower extremity numbness - Plan: MR Lumbar Spine Wo Contrast, AMB referral to orthopedics, gabapentin  (NEURONTIN ) 300 MG capsule  Left shoulder pain, unspecified chronicity - Plan: Ambulatory referral to Physical Therapy  Impingement syndrome of left shoulder Gabapentin  300 mg at bedtime to help with leg numbness.  If you need refills for medications you take chronically, please call your pharmacy. Do not use My Chart to request refills or for acute issues that need immediate attention. If you send a my chart message, it may take a few days to be addressed, specially if I am not in the office.  Please be sure medication list is accurate. If a new problem present, please set up appointment sooner than planned today.

## 2023-09-30 ENCOUNTER — Encounter: Payer: Self-pay | Admitting: Family Medicine

## 2023-10-12 ENCOUNTER — Ambulatory Visit (HOSPITAL_BASED_OUTPATIENT_CLINIC_OR_DEPARTMENT_OTHER): Attending: Family Medicine

## 2023-10-30 ENCOUNTER — Ambulatory Visit: Admitting: Orthopedic Surgery

## 2023-11-22 ENCOUNTER — Ambulatory Visit

## 2023-11-30 ENCOUNTER — Ambulatory Visit: Admitting: Orthopedic Surgery

## 2023-12-21 ENCOUNTER — Ambulatory Visit (HOSPITAL_BASED_OUTPATIENT_CLINIC_OR_DEPARTMENT_OTHER)
Admission: RE | Admit: 2023-12-21 | Discharge: 2023-12-21 | Disposition: A | Discharge status: Home / Self Care | Source: Ambulatory Visit | Attending: Family Medicine | Admitting: Family Medicine

## 2023-12-21 DIAGNOSIS — R200 Anesthesia of skin: Secondary | ICD-10-CM | POA: Diagnosis present

## 2024-01-02 ENCOUNTER — Ambulatory Visit: Payer: Self-pay | Admitting: Family Medicine

## 2024-01-08 ENCOUNTER — Ambulatory Visit: Admitting: Orthopedic Surgery

## 2024-01-12 ENCOUNTER — Encounter: Payer: Self-pay | Admitting: Orthopedic Surgery

## 2024-02-10 ENCOUNTER — Other Ambulatory Visit: Payer: Self-pay | Admitting: Family Medicine

## 2024-02-10 DIAGNOSIS — R519 Headache, unspecified: Secondary | ICD-10-CM

## 2024-03-04 ENCOUNTER — Encounter: Payer: Self-pay | Admitting: Radiology

## 2024-04-23 ENCOUNTER — Ambulatory Visit

## 2024-04-23 VITALS — Ht 65.0 in | Wt 180.0 lb

## 2024-04-23 DIAGNOSIS — Z1211 Encounter for screening for malignant neoplasm of colon: Secondary | ICD-10-CM

## 2024-04-23 MED ORDER — NA SULFATE-K SULFATE-MG SULF 17.5-3.13-1.6 GM/177ML PO SOLN: 1.0000 | Freq: Once | ORAL | 0 refills | Status: AC

## 2024-04-23 NOTE — Progress Notes (Signed)
 Pre visit completed via phone call; Patient verified name, DOB, and address; No egg or soy allergy known to patient;  No issues known to pt with past sedation with any surgeries or procedures; Patient denies ever being told they had issues or difficulty with intubation;  No FH of Malignant Hyperthermia; Pt is not on diet pills; Pt is not on home 02;  Pt is not on blood thinners;  Pt denies issues with constipation  No A fib or A flutter; Have any cardiac testing pending--NO Insurance verified during PV appt--- Medicare A/B and BCBS Fed Supplement Pt can ambulate without assistance;  Pt denies use of chewing tobacco; Discussed diabetic/weight loss medication holds; Discussed NSAID holds; Checked BMI to be less than 50; Pt instructed to use Singlecare.com or GoodRx for a price reduction on prep  Patient's chart reviewed by Norleen Schillings CNRA prior to previsit and patient appropriate for the [facility]; Pre visit completed and red dot placed by patient's name on their procedure day (on provider's schedule); Instructions sent to MyChart per patient request;

## 2024-05-07 NOTE — Progress Notes (Signed)
 [facility] History and Physical   Primary Care Physician:  Jordan, Betty G, MD   Reason for Procedure:  Colorectal cancer screening  Plan:    Colonoscopy   The patient was provided an opportunity to ask questions and all were answered. The patient agreed with the plan.   HPI: Kathryn Phillips is a 74 y.o. female undergoing colonoscopy for colorectal cancer screening.  Patient underwent colonoscopy in 2005 and 2015 both of which were normal.  No family history of colorectal cancer or polyps.  Patient denies change in bowel habits or rectal bleeding at the time of today's exam.   Past Medical History:  Diagnosis Date   Allergic rhinitis 07/19/2010   ARM PAIN, LEFT 11/07/2007   Qualifier: Diagnosis of   By: Tammie MD, Bruce         Atherosclerosis of aorta 03/08/2022   Seen on chest CT on 03/04/22.     BACK PAIN, UPPER 02/18/2009   Qualifier: Diagnosis of   By: Micheal MD, Bruce      Replacing diagnoses that were inactivated after the 08/01/22 regulatory import     Class 1 obesity with body mass index (BMI) of 31.0 to 31.9 in adult 04/23/2019   Cough    Insomnia 04/23/2019   Osteopenia after menopause 05/14/2019   05/10/2019 DEXA:  Results:               Lumbar spine L1-L4    Femoral neck (FN)      T-score    -1.8    RFN: -1.8  LFN: -1.7        Dx:Low Bone Density      FRAX 10-year fracture risk calculator: 4.3 % for any major fracture and 0.6 % for hip fracture.     Trigger finger of right thumb 03/11/2020    Past Surgical History:  Procedure Laterality Date   CESAREAN SECTION     COLONOSCOPY  2015   moviprep (exc)-   myonectomy     NASAL SINUS SURGERY     NECK SURGERY     disc removal   SHOULDER ARTHROSCOPY W/ ROTATOR CUFF REPAIR      Prior to Admission medications  Medication Sig Start Date End Date Taking? Authorizing Provider  aspirin  EC 81 MG tablet Take 1 tablet (81 mg total) by mouth daily. Swallow whole. 05/08/23   Madireddy, Alean SAUNDERS, MD  gabapentin   (NEURONTIN ) 300 MG capsule Take 1 capsule (300 mg total) by mouth at bedtime. Patient taking differently: Take 300 mg by mouth at bedtime as needed. 09/29/23   Jordan, Betty G, MD  Multiple Vitamin (MULTIVITAMIN) tablet Take 1 tablet by mouth daily.    [provider]  nortriptyline  (PAMELOR ) 10 MG capsule Take 10 mg by mouth at bedtime. 01/17/24   [provider]  rosuvastatin  (CRESTOR ) 5 MG tablet Take 1 tablet (5 mg total) by mouth daily. 03/21/23   Madireddy, Alean SAUNDERS, MD  traMADol  (ULTRAM ) 50 MG tablet Take 1 tablet (50 mg total) by mouth every 6 (six) hours as needed. 09/07/23   Towana Ozell BROCKS, MD    Current Outpatient Medications  Medication Sig Dispense Refill   Multiple Vitamin (MULTIVITAMIN) tablet Take 1 tablet by mouth daily.     aspirin  EC 81 MG tablet Take 1 tablet (81 mg total) by mouth daily. Swallow whole. 90 tablet 3   gabapentin  (NEURONTIN ) 300 MG capsule Take 1 capsule (300 mg total) by mouth at bedtime. (Patient taking differently: Take 300 mg by  mouth at bedtime as needed.) 30 capsule 1   nortriptyline  (PAMELOR ) 10 MG capsule Take 10 mg by mouth at bedtime.     rosuvastatin  (CRESTOR ) 5 MG tablet Take 1 tablet (5 mg total) by mouth daily. 90 tablet 3   traMADol  (ULTRAM ) 50 MG tablet Take 1 tablet (50 mg total) by mouth every 6 (six) hours as needed. 15 tablet 0   Current Facility-Administered Medications  Medication Dose Route Frequency Provider Last Rate Last Admin   0.9 %  sodium chloride  infusion  500 mL Intravenous Once Denessa Cavan, Inocente HERO, MD        Allergies as of 05/09/2024 - Review Complete 05/09/2024  Allergen Reaction Noted   Hydrocodone  Nausea And Vomiting 09/15/2011    Family History  Problem Relation Age of Onset   Diabetes Father    Colon cancer Neg Hx    Pancreatic cancer Neg Hx    Rectal cancer Neg Hx    Stomach cancer Neg Hx    Colon polyps Neg Hx    Esophageal cancer Neg Hx     Social History   Socioeconomic History    Marital status: Married    Spouse name: Not on file   Number of children: 1   Years of education: 14   Highest education level: Associate degree: occupational, scientist, product/process development, or vocational program  Occupational History   Occupation: self employed    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  Tobacco Use   Smoking status: Never   Smokeless tobacco: Never  Vaping Use   Vaping status: Never Used  Substance and Sexual Activity   Alcohol use: Not Currently   Drug use: No   Sexual activity: Not on file  Other Topics Concern   Not on file  Social History Narrative   Married   1 son   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   Social Drivers of Health   Tobacco Use: Low Risk (05/09/2024)   Patient History    Smoking Tobacco Use: Never    Smokeless Tobacco Use: Never    Passive Exposure: Not on file  Financial Resource Strain: Low Risk (01/31/2022)   Overall Financial Resource Strain (CARDIA)    Difficulty of Paying Living Expenses: Not hard at all  Food Insecurity: No Food Insecurity (01/31/2022)   Hunger Vital Sign    Worried About Running Out of Food in the Last Year: Never true    Ran Out of Food in the Last Year: Never true  Transportation Needs: No Transportation Needs (01/31/2022)   PRAPARE - Administrator, Civil Service (Medical): No    Lack of Transportation (Non-Medical): No  Physical Activity: Inactive (01/31/2022)   Exercise Vital Sign    Days of Exercise per Week: 0 days    Minutes of Exercise per Session: 0 min  Stress: No Stress Concern Present (01/31/2022)   [employer] of Occupational Health - Occupational Stress Questionnaire    Feeling of Stress : Not at all  Social Connections: Not on file  Intimate Partner Violence: Not on file  Depression (PHQ2-9): Low Risk (04/24/2023)   Depression (PHQ2-9)    PHQ-2 Score: 0  Alcohol Screen: Not on file  Housing: Not on file  Utilities: Not on file  Health Literacy: Not on file     Review of Systems:  All other review of systems negative except as mentioned in the HPI.  Physical Exam: Vital signs BP (!) 142/72   Pulse 74  Temp (!) 97.2 F (36.2 C)   Resp 14   Ht 5' 5 (1.651 m)   Wt 180 lb (81.6 kg)   SpO2 100%   BMI 29.95 kg/m   General:   Alert,  Well-developed, well-nourished, pleasant and cooperative in NAD Airway:  Mallampati 1 Lungs:  Clear throughout to auscultation.   Heart:  Regular rate and rhythm; no murmurs, clicks, rubs,  or gallops. Abdomen:  Soft, nontender and nondistended. Normal bowel sounds.   Neuro/Psych:  Normal mood and affect. A and O x 3  Inocente Hausen, MD [facility]

## 2024-05-09 ENCOUNTER — Encounter: Payer: Self-pay | Admitting: Pediatrics

## 2024-05-09 ENCOUNTER — Ambulatory Visit: Admitting: Pediatrics

## 2024-05-09 VITALS — BP 119/71 | HR 78 | Temp 97.2°F | Resp 15 | Ht 65.0 in | Wt 180.0 lb

## 2024-05-09 DIAGNOSIS — Z1211 Encounter for screening for malignant neoplasm of colon: Secondary | ICD-10-CM | POA: Diagnosis not present

## 2024-05-09 DIAGNOSIS — K648 Other hemorrhoids: Secondary | ICD-10-CM | POA: Diagnosis not present

## 2024-05-09 DIAGNOSIS — K635 Polyp of colon: Secondary | ICD-10-CM | POA: Diagnosis not present

## 2024-05-09 DIAGNOSIS — D123 Benign neoplasm of transverse colon: Secondary | ICD-10-CM

## 2024-05-09 MED ORDER — SODIUM CHLORIDE 0.9 % IV SOLN: 500.0000 mL | Freq: Once | INTRAVENOUS | Status: DC

## 2024-05-09 NOTE — Op Note (Signed)
 [facility] Patient Name: Kathryn Phillips Procedure Date: 05/09/2024 3:08 PM MRN: [id number] Endoscopist: Inocente Hausen , MD, [id number] Age: 74 Referring MD:  Date of Birth: [birth year] Gender: Female Account #: [id number] Procedure:                Colonoscopy Indications:              Screening for colorectal malignant neoplasm, Last                            colonoscopy: 2015 Medicines:                Monitored Anesthesia Care Procedure:                Pre-Anesthesia Assessment:                           - Prior to the procedure, a History and Physical                            was performed, and patient medications and                            allergies were reviewed. The patient's tolerance of                            previous anesthesia was also reviewed. The risks                            and benefits of the procedure and the sedation                            options and risks were discussed with the patient.                            All questions were answered, and informed consent                            was obtained. Prior Anticoagulants: The patient has                            taken no anticoagulant or antiplatelet agents. ASA                            Grade Assessment: II - A patient with mild systemic                            disease. After reviewing the risks and benefits,                            the patient was deemed in satisfactory condition to                            undergo the procedure.  After obtaining informed consent, the colonoscope                            was passed under direct vision. Throughout the                            procedure, the patient's blood pressure, pulse, and                            oxygen saturations were monitored continuously. The                            CF [id number] #[id number] was introduced through the anus                            and advanced to the cecum, identified by                             appendiceal orifice and ileocecal valve. The                            colonoscopy was performed without difficulty. The                            patient tolerated the procedure well. The quality                            of the bowel preparation was adequate to identify                            polyps greater than 5 mm in size. The ileocecal                            valve, appendiceal orifice, and rectum were                            photographed. Scope In: 3:18:12 PM Scope Out: 3:38:28 PM Scope Withdrawal Time: 0 hours 15 minutes 10 seconds  Total Procedure Duration: 0 hours 20 minutes 16 seconds  Findings:                 The perianal and digital rectal examinations were                            normal. Pertinent negatives include normal                            sphincter tone and no palpable rectal lesions.                           A 5 mm polyp was found in the transverse colon. The                            polyp was sessile. The polyp was removed with a  cold snare. Resection and retrieval were complete.                           Internal hemorrhoids were found during retroflexion. Complications:            No immediate complications. Estimated blood loss:                            Minimal. Estimated Blood Loss:     Estimated blood loss was minimal. Impression:               - One 5 mm polyp in the transverse colon, removed                            with a cold snare. Resected and retrieved.                           - Internal hemorrhoids. Recommendation:           - Discharge patient to home (ambulatory).                           - Await pathology results.                           - Repeat colonoscopy for surveillance based on                            pathology results.                           - The findings and recommendations were discussed                            with the patient's family.                            - Patient has a contact number available for                            emergencies. The signs and symptoms of potential                            delayed complications were discussed with the                            patient. Return to normal activities tomorrow.                            Written discharge instructions were provided to the                            patient. Inocente Hausen, MD 05/09/2024 3:42:23 PM This report has been signed electronically.

## 2024-05-09 NOTE — Progress Notes (Signed)
 Pt's states no medical or surgical changes since previsit or office visit.

## 2024-05-09 NOTE — Progress Notes (Signed)
 Sedate, gd SR, tolerated procedure well, VSS, report to RN

## 2024-05-09 NOTE — Progress Notes (Signed)
 Called to room to assist during endoscopic procedure.  Patient ID and intended procedure confirmed with present staff. Received instructions for my participation in the procedure from the performing physician.

## 2024-05-09 NOTE — Patient Instructions (Signed)
 Handouts given: Polyps, Hemorrhoids Resume previous diet. Continue present medications.  Await pathology results. Repeat colonoscopy for surveillance based on pathology results.    YOU HAD AN ENDOSCOPIC PROCEDURE TODAY AT THE [facility]:   Refer to the procedure report that was given to you for any specific questions about what was found during the examination.  If the procedure report does not answer your questions, please call your gastroenterologist to clarify.  If you requested that your care partner not be given the details of your procedure findings, then the procedure report has been included in a sealed envelope for you to review at your convenience later.  YOU SHOULD EXPECT: Some feelings of bloating in the abdomen. Passage of more gas than usual.  Walking can help get rid of the air that was put into your GI tract during the procedure and reduce the bloating. If you had a lower endoscopy (such as a colonoscopy or flexible sigmoidoscopy) you may notice spotting of blood in your stool or on the toilet paper. If you underwent a bowel prep for your procedure, you may not have a normal bowel movement for a few days.  Please Note:  You might notice some irritation and congestion in your nose or some drainage.  This is from the oxygen used during your procedure.  There is no need for concern and it should clear up in a day or so.  SYMPTOMS TO REPORT IMMEDIATELY:  Following lower endoscopy (colonoscopy or flexible sigmoidoscopy):  Excessive amounts of blood in the stool  Significant tenderness or worsening of abdominal pains  Swelling of the abdomen that is new, acute  Fever of 100F or higher  For urgent or emergent issues, a gastroenterologist can be reached at any hour by calling [phone]. Do not use MyChart messaging for urgent concerns.    DIET:  We do recommend a small meal at first, but then you may proceed to your regular diet.  Drink plenty of fluids but you  should avoid alcoholic beverages for 24 hours.  ACTIVITY:  You should plan to take it easy for the rest of today and you should NOT DRIVE or use heavy machinery until tomorrow (because of the sedation medicines used during the test).    FOLLOW UP: Our staff will call the number listed on your records the next business day following your procedure.  We will call around 7:15- 8:00 am to check on you and address any questions or concerns that you may have regarding the information given to you following your procedure. If we do not reach you, we will leave a message.     If any biopsies were taken you will be contacted by phone or by letter within the next 1-3 weeks.  Please call us  at [phone] if you have not heard about the biopsies in 3 weeks.    SIGNATURES/CONFIDENTIALITY: You and/or your care partner have signed paperwork which will be entered into your electronic medical record.  These signatures attest to the fact that that the information above on your After Visit Summary has been reviewed and is understood.  Full responsibility of the confidentiality of this discharge information lies with you and/or your care-partner.

## 2024-05-10 ENCOUNTER — Telehealth: Payer: Self-pay

## 2024-05-10 NOTE — Telephone Encounter (Signed)
 Left message on answering machine.

## 2024-05-14 LAB — SURGICAL PATHOLOGY

## 2024-05-15 ENCOUNTER — Ambulatory Visit: Payer: Self-pay | Admitting: Pediatrics
